# Patient Record
Sex: Male | Born: 1940 | Hispanic: No | Marital: Single
Health system: Southern US, Community
[De-identification: ages and names within clinical notes are randomized; demographics above are authoritative.]

---

## 2008-09-30 ENCOUNTER — Emergency Department: Payer: Self-pay | Admitting: Unknown Physician Specialty

## 2009-06-13 ENCOUNTER — Inpatient Hospital Stay: Payer: Self-pay | Admitting: Internal Medicine

## 2009-11-20 ENCOUNTER — Encounter: Payer: Self-pay | Admitting: Family Medicine

## 2009-11-23 ENCOUNTER — Ambulatory Visit: Payer: Self-pay | Admitting: Internal Medicine

## 2009-11-23 ENCOUNTER — Inpatient Hospital Stay: Payer: Self-pay | Admitting: Internal Medicine

## 2012-01-25 ENCOUNTER — Inpatient Hospital Stay: Payer: Self-pay | Admitting: Internal Medicine

## 2012-01-25 LAB — CK-MB: CK-MB: <0.5 ng/mL — ABNORMAL LOW (ref 0.5–3.6)

## 2012-01-25 LAB — CBC
HGB: 14.6 g/dL (ref 13.0–18.0)
MCH: 30 pg (ref 26.0–34.0)
MCHC: 32.8 g/dL (ref 32.0–36.0)
MCV: 92 fL (ref 80–100)
RDW: 12.8 % (ref 11.5–14.5)

## 2012-01-25 LAB — COMPREHENSIVE METABOLIC PANEL
Albumin: 3.3 g/dL — ABNORMAL LOW (ref 3.4–5.0)
Alkaline Phosphatase: 70 U/L (ref 50–136)
Bilirubin,Total: 0.6 mg/dL (ref 0.2–1.0)
EGFR (Non-African Amer.): >60
Osmolality: 283 (ref 275–301)
Potassium: 4 mmol/L (ref 3.5–5.1)
SGPT (ALT): 10 U/L — ABNORMAL LOW
Total Protein: 8.5 g/dL — ABNORMAL HIGH (ref 6.4–8.2)

## 2012-01-25 LAB — TROPONIN I
Troponin-I: <0.02 ng/mL
Troponin-I: <0.02 ng/mL

## 2012-01-25 LAB — CK TOTAL AND CKMB (NOT AT ARMC)
CK, Total: 75 U/L (ref 35–232)
CK-MB: <0.5 ng/mL — ABNORMAL LOW (ref 0.5–3.6)

## 2012-01-25 LAB — PROTIME-INR: INR: 0.9

## 2012-01-26 DIAGNOSIS — R079 Chest pain, unspecified: Secondary | ICD-10-CM

## 2012-01-26 LAB — BASIC METABOLIC PANEL
Anion Gap: 10 (ref 7–16)
BUN: 6 mg/dL — ABNORMAL LOW (ref 7–18)
Chloride: 108 mmol/L — ABNORMAL HIGH (ref 98–107)
Creatinine: 0.83 mg/dL (ref 0.60–1.30)
EGFR (Non-African Amer.): >60
Glucose: 94 mg/dL (ref 65–99)
Potassium: 3.7 mmol/L (ref 3.5–5.1)

## 2012-01-26 LAB — CBC WITH DIFFERENTIAL/PLATELET
Basophil #: 0 10*3/uL (ref 0.0–0.1)
Eosinophil #: 0 10*3/uL (ref 0.0–0.7)
Eosinophil %: 0.4 %
HCT: 38.6 % — ABNORMAL LOW (ref 40.0–52.0)
HGB: 13 g/dL (ref 13.0–18.0)
Lymphocyte #: 1.9 10*3/uL (ref 1.0–3.6)
Lymphocyte %: 37.5 %
MCH: 30.3 pg (ref 26.0–34.0)
MCV: 90 fL (ref 80–100)
Monocyte %: 10.7 %
Platelet: 151 10*3/uL (ref 150–440)
RDW: 13.5 % (ref 11.5–14.5)

## 2012-01-26 LAB — CK-MB: CK-MB: <0.5 ng/mL — ABNORMAL LOW (ref 0.5–3.6)

## 2013-03-27 ENCOUNTER — Emergency Department: Payer: Self-pay | Admitting: Internal Medicine

## 2013-03-27 LAB — COMPREHENSIVE METABOLIC PANEL
Albumin: 3.1 g/dL — ABNORMAL LOW (ref 3.4–5.0)
Alkaline Phosphatase: 76 U/L (ref 50–136)
Anion Gap: 2 — ABNORMAL LOW (ref 7–16)
BUN: 7 mg/dL (ref 7–18)
Calcium, Total: 9.2 mg/dL (ref 8.5–10.1)
Chloride: 106 mmol/L (ref 98–107)
Co2: 30 mmol/L (ref 21–32)
EGFR (African American): >60
EGFR (Non-African Amer.): >60
Glucose: 98 mg/dL (ref 65–99)
Osmolality: 274 (ref 275–301)
Potassium: 4.1 mmol/L (ref 3.5–5.1)
SGOT(AST): 14 U/L — ABNORMAL LOW (ref 15–37)
SGPT (ALT): 10 U/L — ABNORMAL LOW (ref 12–78)

## 2013-03-27 LAB — CBC WITH DIFFERENTIAL/PLATELET
Basophil #: 0 10*3/uL (ref 0.0–0.1)
Eosinophil #: 0 10*3/uL (ref 0.0–0.7)
HCT: 40.7 % (ref 40.0–52.0)
HGB: 13.3 g/dL (ref 13.0–18.0)
Lymphocyte #: 1.3 10*3/uL (ref 1.0–3.6)
Lymphocyte %: 20.4 %
MCH: 29.5 pg (ref 26.0–34.0)
MCHC: 32.7 g/dL (ref 32.0–36.0)
MCV: 90 fL (ref 80–100)
Monocyte %: 8.5 %
WBC: 6.5 10*3/uL (ref 3.8–10.6)

## 2013-04-07 ENCOUNTER — Ambulatory Visit: Payer: Self-pay | Admitting: Oncology

## 2013-04-07 LAB — CBC WITH DIFFERENTIAL/PLATELET
Basophil #: 0 10*3/uL (ref 0.0–0.1)
Basophil %: 0.6 %
Eosinophil #: 0 10*3/uL (ref 0.0–0.7)
Eosinophil %: 0.1 %
HGB: 14.2 g/dL (ref 13.0–18.0)
Lymphocyte #: 1.5 10*3/uL (ref 1.0–3.6)
Lymphocyte %: 19.5 %
MCH: 29.3 pg (ref 26.0–34.0)
MCV: 89 fL (ref 80–100)
Monocyte #: 0.7 x10 3/mm (ref 0.2–1.0)
Neutrophil #: 5.3 10*3/uL (ref 1.4–6.5)
Platelet: 233 10*3/uL (ref 150–440)
RDW: 12.7 % (ref 11.5–14.5)

## 2013-04-07 LAB — COMPREHENSIVE METABOLIC PANEL
Alkaline Phosphatase: 87 U/L (ref 50–136)
BUN: 8 mg/dL (ref 7–18)
Bilirubin,Total: 0.6 mg/dL (ref 0.2–1.0)
Calcium, Total: 10.1 mg/dL (ref 8.5–10.1)
Co2: 29 mmol/L (ref 21–32)
Creatinine: 1.09 mg/dL (ref 0.60–1.30)
EGFR (African American): >60
EGFR (Non-African Amer.): >60
Glucose: 131 mg/dL — ABNORMAL HIGH (ref 65–99)
Potassium: 4 mmol/L (ref 3.5–5.1)
SGOT(AST): 11 U/L — ABNORMAL LOW (ref 15–37)
SGPT (ALT): 13 U/L (ref 12–78)
Sodium: 138 mmol/L (ref 136–145)
Total Protein: 9.2 g/dL — ABNORMAL HIGH (ref 6.4–8.2)

## 2013-04-12 ENCOUNTER — Ambulatory Visit: Payer: Self-pay | Admitting: Radiation Oncology

## 2013-04-14 ENCOUNTER — Ambulatory Visit: Payer: Self-pay | Admitting: Oncology

## 2013-04-18 ENCOUNTER — Ambulatory Visit: Payer: Self-pay | Admitting: Vascular Surgery

## 2013-04-20 ENCOUNTER — Ambulatory Visit: Payer: Self-pay | Admitting: Oncology

## 2013-04-20 LAB — CBC CANCER CENTER
Basophil #: 0 x10 3/mm (ref 0.0–0.1)
Basophil %: 0.5 %
Eosinophil #: 0 x10 3/mm (ref 0.0–0.7)
Eosinophil %: 0.1 %
HCT: 37.4 % — ABNORMAL LOW (ref 40.0–52.0)
HGB: 12.4 g/dL — ABNORMAL LOW (ref 13.0–18.0)
MCH: 29.2 pg (ref 26.0–34.0)
MCHC: 33.1 g/dL (ref 32.0–36.0)
MCV: 88 fL (ref 80–100)
Monocyte #: 0.8 x10 3/mm (ref 0.2–1.0)
Monocyte %: 10.2 %
Neutrophil %: 74.9 %
RBC: 4.24 10*6/uL — ABNORMAL LOW (ref 4.40–5.90)
RDW: 13.1 % (ref 11.5–14.5)

## 2013-04-20 LAB — COMPREHENSIVE METABOLIC PANEL
Albumin: 2.7 g/dL — ABNORMAL LOW (ref 3.4–5.0)
Alkaline Phosphatase: 71 U/L (ref 50–136)
Anion Gap: 8 (ref 7–16)
BUN: 8 mg/dL (ref 7–18)
Bilirubin,Total: 0.6 mg/dL (ref 0.2–1.0)
Chloride: 101 mmol/L (ref 98–107)
Co2: 29 mmol/L (ref 21–32)
EGFR (African American): >60
EGFR (Non-African Amer.): >60
Glucose: 114 mg/dL — ABNORMAL HIGH (ref 65–99)
SGOT(AST): 11 U/L — ABNORMAL LOW (ref 15–37)
Total Protein: 8.2 g/dL (ref 6.4–8.2)

## 2013-04-25 LAB — CBC CANCER CENTER
Basophil #: 0 x10 3/mm (ref 0.0–0.1)
Basophil %: 0.9 %
Eosinophil #: 0 x10 3/mm (ref 0.0–0.7)
MCH: 29.7 pg (ref 26.0–34.0)
MCHC: 33.7 g/dL (ref 32.0–36.0)
MCV: 88 fL (ref 80–100)
Monocyte #: 0.1 x10 3/mm — ABNORMAL LOW (ref 0.2–1.0)
Monocyte %: 2 %
Neutrophil %: 75 %
Platelet: 165 x10 3/mm (ref 150–440)
RBC: 4.02 10*6/uL — ABNORMAL LOW (ref 4.40–5.90)
WBC: 3.7 x10 3/mm — ABNORMAL LOW (ref 3.8–10.6)

## 2013-04-25 LAB — COMPREHENSIVE METABOLIC PANEL
Albumin: 2.7 g/dL — ABNORMAL LOW (ref 3.4–5.0)
Alkaline Phosphatase: 63 U/L (ref 50–136)
BUN: 13 mg/dL (ref 7–18)
Calcium, Total: 9.2 mg/dL (ref 8.5–10.1)
Chloride: 97 mmol/L — ABNORMAL LOW (ref 98–107)
Creatinine: 0.83 mg/dL (ref 0.60–1.30)
EGFR (Non-African Amer.): >60
Osmolality: 275 (ref 275–301)
Potassium: 3.7 mmol/L (ref 3.5–5.1)
SGOT(AST): 12 U/L — ABNORMAL LOW (ref 15–37)
SGPT (ALT): 17 U/L (ref 12–78)

## 2013-05-02 LAB — BASIC METABOLIC PANEL
Anion Gap: 8 (ref 7–16)
BUN: 10 mg/dL (ref 7–18)
Calcium, Total: 9.3 mg/dL (ref 8.5–10.1)
Chloride: 103 mmol/L (ref 98–107)
Co2: 30 mmol/L (ref 21–32)
Glucose: 109 mg/dL — ABNORMAL HIGH (ref 65–99)
Osmolality: 281 (ref 275–301)
Potassium: 3.4 mmol/L — ABNORMAL LOW (ref 3.5–5.1)

## 2013-05-02 LAB — CBC CANCER CENTER
Basophil #: 0.1 x10 3/mm (ref 0.0–0.1)
HCT: 36.4 % — ABNORMAL LOW (ref 40.0–52.0)
HGB: 11.7 g/dL — ABNORMAL LOW (ref 13.0–18.0)
Lymphocyte %: 9.4 %
MCHC: 32.3 g/dL (ref 32.0–36.0)
MCV: 89 fL (ref 80–100)
Monocyte %: 4.3 %
Neutrophil #: 18.2 x10 3/mm — ABNORMAL HIGH (ref 1.4–6.5)
Neutrophil %: 85.9 %
Platelet: 126 x10 3/mm — ABNORMAL LOW (ref 150–440)
RBC: 4.1 10*6/uL — ABNORMAL LOW (ref 4.40–5.90)
RDW: 13.5 % (ref 11.5–14.5)
WBC: 21.2 x10 3/mm — ABNORMAL HIGH (ref 3.8–10.6)

## 2013-05-11 LAB — CBC CANCER CENTER
Basophil #: 0.1 x10 3/mm (ref 0.0–0.1)
Basophil %: 1.6 %
Eosinophil #: 0 x10 3/mm (ref 0.0–0.7)
Eosinophil %: 0 %
HGB: 10.7 g/dL — ABNORMAL LOW (ref 13.0–18.0)
Lymphocyte #: 0.9 x10 3/mm — ABNORMAL LOW (ref 1.0–3.6)
Lymphocyte %: 11.5 %
MCH: 29.3 pg (ref 26.0–34.0)
MCV: 89 fL (ref 80–100)
Monocyte #: 0.7 x10 3/mm (ref 0.2–1.0)
Neutrophil %: 78.2 %
Platelet: 227 x10 3/mm (ref 150–440)
RBC: 3.65 10*6/uL — ABNORMAL LOW (ref 4.40–5.90)
RDW: 14 % (ref 11.5–14.5)
WBC: 7.7 x10 3/mm (ref 3.8–10.6)

## 2013-05-11 LAB — COMPREHENSIVE METABOLIC PANEL
Albumin: 2.5 g/dL — ABNORMAL LOW (ref 3.4–5.0)
Alkaline Phosphatase: 77 U/L (ref 50–136)
BUN: 8 mg/dL (ref 7–18)
Calcium, Total: 9.3 mg/dL (ref 8.5–10.1)
Chloride: 103 mmol/L (ref 98–107)
SGPT (ALT): 16 U/L (ref 12–78)
Sodium: 140 mmol/L (ref 136–145)
Total Protein: 7.4 g/dL (ref 6.4–8.2)

## 2013-05-15 ENCOUNTER — Ambulatory Visit: Payer: Self-pay | Admitting: Oncology

## 2013-05-16 LAB — CBC CANCER CENTER
Basophil %: 0.5 %
Eosinophil #: 0 x10 3/mm (ref 0.0–0.7)
Eosinophil %: 0 %
HCT: 33 % — ABNORMAL LOW (ref 40.0–52.0)
HGB: 10.9 g/dL — ABNORMAL LOW (ref 13.0–18.0)
Lymphocyte #: 0.8 x10 3/mm — ABNORMAL LOW (ref 1.0–3.6)
Lymphocyte %: 23.1 %
MCH: 29.2 pg (ref 26.0–34.0)
MCHC: 33.1 g/dL (ref 32.0–36.0)
MCV: 88 fL (ref 80–100)
Monocyte #: 0.1 x10 3/mm — ABNORMAL LOW (ref 0.2–1.0)
Monocyte %: 1.6 %
Neutrophil #: 2.7 x10 3/mm (ref 1.4–6.5)
Neutrophil %: 74.8 %
RDW: 14 % (ref 11.5–14.5)
WBC: 3.6 x10 3/mm — ABNORMAL LOW (ref 3.8–10.6)

## 2013-05-16 LAB — COMPREHENSIVE METABOLIC PANEL
BUN: 16 mg/dL (ref 7–18)
Bilirubin,Total: 0.5 mg/dL (ref 0.2–1.0)
Calcium, Total: 9.5 mg/dL (ref 8.5–10.1)
Chloride: 98 mmol/L (ref 98–107)
Creatinine: 0.91 mg/dL (ref 0.60–1.30)
EGFR (African American): >60
EGFR (Non-African Amer.): >60
Glucose: 123 mg/dL — ABNORMAL HIGH (ref 65–99)
SGOT(AST): 15 U/L (ref 15–37)
SGPT (ALT): 15 U/L (ref 12–78)
Sodium: 137 mmol/L (ref 136–145)

## 2013-05-25 LAB — CBC CANCER CENTER
Basophil %: 0.1 %
Eosinophil #: 0 x10 3/mm (ref 0.0–0.7)
Eosinophil %: 0 %
HCT: 35.5 % — ABNORMAL LOW (ref 40.0–52.0)
Lymphocyte %: 9.2 %
MCH: 29.7 pg (ref 26.0–34.0)
MCHC: 32.5 g/dL (ref 32.0–36.0)
Monocyte #: 1.1 x10 3/mm — ABNORMAL HIGH (ref 0.2–1.0)
Neutrophil %: 85 %
Platelet: 146 x10 3/mm — ABNORMAL LOW (ref 150–440)
RBC: 3.88 10*6/uL — ABNORMAL LOW (ref 4.40–5.90)
RDW: 15.9 % — ABNORMAL HIGH (ref 11.5–14.5)
WBC: 19.3 x10 3/mm — ABNORMAL HIGH (ref 3.8–10.6)

## 2013-06-02 LAB — CBC CANCER CENTER
Basophil %: 0.5 %
Lymphocyte #: 1.3 x10 3/mm (ref 1.0–3.6)
Lymphocyte %: 17.3 %
MCH: 30.3 pg (ref 26.0–34.0)
MCHC: 32.9 g/dL (ref 32.0–36.0)
MCV: 92 fL (ref 80–100)
Monocyte #: 0.7 x10 3/mm (ref 0.2–1.0)
Neutrophil #: 5.7 x10 3/mm (ref 1.4–6.5)
Neutrophil %: 72.8 %
RDW: 16.8 % — ABNORMAL HIGH (ref 11.5–14.5)

## 2013-06-08 LAB — CBC CANCER CENTER
Basophil #: 0.1 x10 3/mm (ref 0.0–0.1)
Basophil %: 0.9 %
Eosinophil #: 0 x10 3/mm (ref 0.0–0.7)
HCT: 33.2 % — ABNORMAL LOW (ref 40.0–52.0)
HGB: 11.1 g/dL — ABNORMAL LOW (ref 13.0–18.0)
Lymphocyte #: 1.2 x10 3/mm (ref 1.0–3.6)
MCHC: 33.4 g/dL (ref 32.0–36.0)
MCV: 92 fL (ref 80–100)
Monocyte %: 11 %
Neutrophil #: 4.5 x10 3/mm (ref 1.4–6.5)
Platelet: 186 x10 3/mm (ref 150–440)
RBC: 3.61 10*6/uL — ABNORMAL LOW (ref 4.40–5.90)
WBC: 6.5 x10 3/mm (ref 3.8–10.6)

## 2013-06-08 LAB — COMPREHENSIVE METABOLIC PANEL
Albumin: 3.1 g/dL — ABNORMAL LOW (ref 3.4–5.0)
Anion Gap: 9 (ref 7–16)
BUN: 8 mg/dL (ref 7–18)
Calcium, Total: 9.3 mg/dL (ref 8.5–10.1)
Chloride: 104 mmol/L (ref 98–107)
Co2: 28 mmol/L (ref 21–32)
EGFR (Non-African Amer.): >60
Glucose: 103 mg/dL — ABNORMAL HIGH (ref 65–99)
SGOT(AST): 9 U/L — ABNORMAL LOW (ref 15–37)
SGPT (ALT): 12 U/L (ref 12–78)
Sodium: 141 mmol/L (ref 136–145)

## 2013-06-13 LAB — COMPREHENSIVE METABOLIC PANEL
Albumin: 3 g/dL — ABNORMAL LOW (ref 3.4–5.0)
Alkaline Phosphatase: 58 U/L (ref 50–136)
Anion Gap: 4 — ABNORMAL LOW (ref 7–16)
BUN: 14 mg/dL (ref 7–18)
Bilirubin,Total: 0.5 mg/dL (ref 0.2–1.0)
Co2: 32 mmol/L (ref 21–32)
EGFR (Non-African Amer.): >60
Glucose: 103 mg/dL — ABNORMAL HIGH (ref 65–99)
Osmolality: 278 (ref 275–301)
SGOT(AST): 14 U/L — ABNORMAL LOW (ref 15–37)
Total Protein: 7.5 g/dL (ref 6.4–8.2)

## 2013-06-13 LAB — CBC CANCER CENTER
Basophil #: 0.1 x10 3/mm (ref 0.0–0.1)
Eosinophil #: 0 x10 3/mm (ref 0.0–0.7)
HCT: 32.1 % — ABNORMAL LOW (ref 40.0–52.0)
HGB: 10.5 g/dL — ABNORMAL LOW (ref 13.0–18.0)
Lymphocyte #: 1.1 x10 3/mm (ref 1.0–3.6)
MCH: 30 pg (ref 26.0–34.0)
MCV: 91 fL (ref 80–100)
Neutrophil %: 67.3 %
Platelet: 167 x10 3/mm (ref 150–440)
RBC: 3.52 10*6/uL — ABNORMAL LOW (ref 4.40–5.90)
RDW: 17.3 % — ABNORMAL HIGH (ref 11.5–14.5)
WBC: 3.8 x10 3/mm (ref 3.8–10.6)

## 2013-06-14 ENCOUNTER — Ambulatory Visit: Payer: Self-pay | Admitting: Oncology

## 2013-06-22 ENCOUNTER — Ambulatory Visit: Payer: Self-pay | Admitting: Oncology

## 2013-06-22 LAB — COMPREHENSIVE METABOLIC PANEL
Alkaline Phosphatase: 111 U/L (ref 50–136)
Anion Gap: 8 (ref 7–16)
BUN: 6 mg/dL — ABNORMAL LOW (ref 7–18)
Bilirubin,Total: 0.2 mg/dL (ref 0.2–1.0)
Chloride: 106 mmol/L (ref 98–107)
Co2: 31 mmol/L (ref 21–32)
Creatinine: 0.96 mg/dL (ref 0.60–1.30)
EGFR (African American): >60
EGFR (Non-African Amer.): >60
Glucose: 104 mg/dL — ABNORMAL HIGH (ref 65–99)
SGPT (ALT): 13 U/L (ref 12–78)
Sodium: 145 mmol/L (ref 136–145)

## 2013-06-22 LAB — CBC CANCER CENTER
Basophil #: 0.1 x10 3/mm (ref 0.0–0.1)
Basophil %: 0.6 %
Eosinophil %: 0 %
HCT: 33.4 % — ABNORMAL LOW (ref 40.0–52.0)
HGB: 10.9 g/dL — ABNORMAL LOW (ref 13.0–18.0)
Lymphocyte #: 2.3 x10 3/mm (ref 1.0–3.6)
Lymphocyte %: 10.5 %
Monocyte #: 1.3 x10 3/mm — ABNORMAL HIGH (ref 0.2–1.0)
Neutrophil %: 83.2 %
RBC: 3.56 10*6/uL — ABNORMAL LOW (ref 4.40–5.90)
RDW: 18.1 % — ABNORMAL HIGH (ref 11.5–14.5)

## 2013-06-27 LAB — CBC CANCER CENTER
Basophil #: 0 x10 3/mm (ref 0.0–0.1)
HCT: 30.7 % — ABNORMAL LOW (ref 40.0–52.0)
HGB: 10.2 g/dL — ABNORMAL LOW (ref 13.0–18.0)
Lymphocyte #: 1.4 x10 3/mm (ref 1.0–3.6)
Lymphocyte %: 18.5 %
MCH: 30.8 pg (ref 26.0–34.0)
MCHC: 33.3 g/dL (ref 32.0–36.0)
MCV: 93 fL (ref 80–100)
Monocyte #: 0.5 x10 3/mm (ref 0.2–1.0)
Neutrophil #: 5.5 x10 3/mm (ref 1.4–6.5)
Neutrophil %: 74.2 %
Platelet: 137 x10 3/mm — ABNORMAL LOW (ref 150–440)
WBC: 7.3 x10 3/mm (ref 3.8–10.6)

## 2013-06-27 LAB — COMPREHENSIVE METABOLIC PANEL
Albumin: 2.9 g/dL — ABNORMAL LOW (ref 3.4–5.0)
BUN: 6 mg/dL — ABNORMAL LOW (ref 7–18)
Bilirubin,Total: 0.4 mg/dL (ref 0.2–1.0)
Calcium, Total: 8.3 mg/dL — ABNORMAL LOW (ref 8.5–10.1)
Chloride: 108 mmol/L — ABNORMAL HIGH (ref 98–107)
Co2: 32 mmol/L (ref 21–32)
Creatinine: 0.76 mg/dL (ref 0.60–1.30)
Glucose: 97 mg/dL (ref 65–99)
Osmolality: 292 (ref 275–301)
Potassium: 3.3 mmol/L — ABNORMAL LOW (ref 3.5–5.1)
SGOT(AST): 10 U/L — ABNORMAL LOW (ref 15–37)
SGPT (ALT): 12 U/L (ref 12–78)
Sodium: 148 mmol/L — ABNORMAL HIGH (ref 136–145)
Total Protein: 6.8 g/dL (ref 6.4–8.2)

## 2013-07-11 LAB — CBC CANCER CENTER
Basophil #: 0 x10 3/mm (ref 0.0–0.1)
Eosinophil #: 0 x10 3/mm (ref 0.0–0.7)
HCT: 31.7 % — ABNORMAL LOW (ref 40.0–52.0)
Lymphocyte %: 14.9 %
MCH: 31.2 pg (ref 26.0–34.0)
MCHC: 33.8 g/dL (ref 32.0–36.0)
MCV: 92 fL (ref 80–100)
Monocyte #: 0.6 x10 3/mm (ref 0.2–1.0)
Neutrophil #: 6.3 x10 3/mm (ref 1.4–6.5)
Neutrophil %: 77.1 %
Platelet: 222 x10 3/mm (ref 150–440)
RBC: 3.43 10*6/uL — ABNORMAL LOW (ref 4.40–5.90)
RDW: 17.2 % — ABNORMAL HIGH (ref 11.5–14.5)
WBC: 8.2 x10 3/mm (ref 3.8–10.6)

## 2013-07-11 LAB — COMPREHENSIVE METABOLIC PANEL
Albumin: 3.1 g/dL — ABNORMAL LOW (ref 3.4–5.0)
Alkaline Phosphatase: 72 U/L (ref 50–136)
BUN: 8 mg/dL (ref 7–18)
Bilirubin,Total: 0.5 mg/dL (ref 0.2–1.0)
Chloride: 105 mmol/L (ref 98–107)
Co2: 29 mmol/L (ref 21–32)
Creatinine: 0.81 mg/dL (ref 0.60–1.30)
EGFR (African American): >60
EGFR (Non-African Amer.): >60
Glucose: 110 mg/dL — ABNORMAL HIGH (ref 65–99)
Osmolality: 277 (ref 275–301)
SGPT (ALT): 11 U/L — ABNORMAL LOW (ref 12–78)

## 2013-07-15 ENCOUNTER — Ambulatory Visit: Payer: Self-pay | Admitting: Oncology

## 2013-07-18 LAB — CBC CANCER CENTER
Basophil #: 0 x10 3/mm (ref 0.0–0.1)
Basophil %: 0.6 %
HGB: 10.7 g/dL — ABNORMAL LOW (ref 13.0–18.0)
Lymphocyte #: 0.6 x10 3/mm — ABNORMAL LOW (ref 1.0–3.6)
Monocyte #: 0.6 x10 3/mm (ref 0.2–1.0)
Monocyte %: 9.3 %
Neutrophil %: 80.6 %
Platelet: 197 x10 3/mm (ref 150–440)
RDW: 16.7 % — ABNORMAL HIGH (ref 11.5–14.5)
WBC: 6.9 x10 3/mm (ref 3.8–10.6)

## 2013-07-18 LAB — BASIC METABOLIC PANEL
BUN: 9 mg/dL (ref 7–18)
Chloride: 99 mmol/L (ref 98–107)
Creatinine: 0.91 mg/dL (ref 0.60–1.30)
EGFR (African American): >60
EGFR (Non-African Amer.): >60
Glucose: 119 mg/dL — ABNORMAL HIGH (ref 65–99)
Osmolality: 276 (ref 275–301)
Sodium: 138 mmol/L (ref 136–145)

## 2013-07-25 LAB — COMPREHENSIVE METABOLIC PANEL
Alkaline Phosphatase: 62 U/L (ref 50–136)
Anion Gap: 5 — ABNORMAL LOW (ref 7–16)
Bilirubin,Total: 0.3 mg/dL (ref 0.2–1.0)
Calcium, Total: 9 mg/dL (ref 8.5–10.1)
Creatinine: 0.7 mg/dL (ref 0.60–1.30)
EGFR (African American): >60
EGFR (Non-African Amer.): >60
Glucose: 120 mg/dL — ABNORMAL HIGH (ref 65–99)
Osmolality: 274 (ref 275–301)
SGOT(AST): 5 U/L — ABNORMAL LOW (ref 15–37)
Total Protein: 7.3 g/dL (ref 6.4–8.2)

## 2013-07-25 LAB — CBC CANCER CENTER
Basophil #: 0 x10 3/mm (ref 0.0–0.1)
Basophil %: 0.5 %
Eosinophil #: 0 x10 3/mm (ref 0.0–0.7)
HCT: 30.6 % — ABNORMAL LOW (ref 40.0–52.0)
Lymphocyte %: 10 %
MCH: 31.2 pg (ref 26.0–34.0)
MCHC: 33.7 g/dL (ref 32.0–36.0)
MCV: 93 fL (ref 80–100)
Monocyte #: 0.5 x10 3/mm (ref 0.2–1.0)
Monocyte %: 9.9 %
Neutrophil #: 3.7 x10 3/mm (ref 1.4–6.5)
Neutrophil %: 79.5 %
Platelet: 184 x10 3/mm (ref 150–440)
RBC: 3.31 10*6/uL — ABNORMAL LOW (ref 4.40–5.90)
RDW: 16.1 % — ABNORMAL HIGH (ref 11.5–14.5)

## 2013-08-01 LAB — CBC CANCER CENTER
Basophil #: 0 x10 3/mm (ref 0.0–0.1)
Basophil %: 0.7 %
Eosinophil #: 0 x10 3/mm (ref 0.0–0.7)
HCT: 31.5 % — ABNORMAL LOW (ref 40.0–52.0)
HGB: 10.6 g/dL — ABNORMAL LOW (ref 13.0–18.0)
MCHC: 33.5 g/dL (ref 32.0–36.0)
MCV: 94 fL (ref 80–100)
Monocyte #: 0.3 x10 3/mm (ref 0.2–1.0)
Monocyte %: 9.9 %
RBC: 3.36 10*6/uL — ABNORMAL LOW (ref 4.40–5.90)
RDW: 16 % — ABNORMAL HIGH (ref 11.5–14.5)

## 2013-08-01 LAB — COMPREHENSIVE METABOLIC PANEL
Alkaline Phosphatase: 57 U/L (ref 50–136)
Anion Gap: 6 — ABNORMAL LOW (ref 7–16)
Calcium, Total: 9 mg/dL (ref 8.5–10.1)
Co2: 29 mmol/L (ref 21–32)
Creatinine: 0.77 mg/dL (ref 0.60–1.30)
Glucose: 103 mg/dL — ABNORMAL HIGH (ref 65–99)
Osmolality: 273 (ref 275–301)
Potassium: 4.1 mmol/L (ref 3.5–5.1)
SGPT (ALT): 10 U/L — ABNORMAL LOW (ref 12–78)
Sodium: 137 mmol/L (ref 136–145)
Total Protein: 7.1 g/dL (ref 6.4–8.2)

## 2013-08-08 LAB — CBC CANCER CENTER
Basophil #: 0 x10 3/mm (ref 0.0–0.1)
Basophil %: 0.6 %
Lymphocyte #: 0.3 x10 3/mm — ABNORMAL LOW (ref 1.0–3.6)
Lymphocyte %: 10.3 %
MCHC: 33.4 g/dL (ref 32.0–36.0)
MCV: 94 fL (ref 80–100)
Monocyte #: 0.2 x10 3/mm (ref 0.2–1.0)
Monocyte %: 7.9 %
Neutrophil #: 2 x10 3/mm (ref 1.4–6.5)
Neutrophil %: 81 %
RDW: 15.6 % — ABNORMAL HIGH (ref 11.5–14.5)

## 2013-08-08 LAB — BASIC METABOLIC PANEL
Calcium, Total: 9.3 mg/dL (ref 8.5–10.1)
Chloride: 103 mmol/L (ref 98–107)
Co2: 29 mmol/L (ref 21–32)
Creatinine: 0.64 mg/dL (ref 0.60–1.30)
Glucose: 102 mg/dL — ABNORMAL HIGH (ref 65–99)
Potassium: 4.3 mmol/L (ref 3.5–5.1)

## 2013-08-10 LAB — CBC CANCER CENTER
Basophil %: 0.5 %
Eosinophil %: 0 %
Lymphocyte #: 0.3 x10 3/mm — ABNORMAL LOW (ref 1.0–3.6)
MCHC: 33.6 g/dL (ref 32.0–36.0)
Monocyte %: 6.9 %
Neutrophil #: 2 x10 3/mm (ref 1.4–6.5)
Platelet: 107 x10 3/mm — ABNORMAL LOW (ref 150–440)
RBC: 3.57 10*6/uL — ABNORMAL LOW (ref 4.40–5.90)
RDW: 16.1 % — ABNORMAL HIGH (ref 11.5–14.5)

## 2013-08-15 ENCOUNTER — Ambulatory Visit: Payer: Self-pay | Admitting: Oncology

## 2013-08-16 LAB — CBC CANCER CENTER
Basophil #: 0 x10 3/mm (ref 0.0–0.1)
Basophil %: 0.5 %
HCT: 34.3 % — ABNORMAL LOW (ref 40.0–52.0)
Lymphocyte #: 0.2 x10 3/mm — ABNORMAL LOW (ref 1.0–3.6)
Lymphocyte %: 12.2 %
MCH: 31.4 pg (ref 26.0–34.0)
MCHC: 32.7 g/dL (ref 32.0–36.0)
MCV: 96 fL (ref 80–100)
Monocyte %: 7.8 %
Neutrophil %: 79.4 %
Platelet: 89 x10 3/mm — ABNORMAL LOW (ref 150–440)
RBC: 3.57 10*6/uL — ABNORMAL LOW (ref 4.40–5.90)
RDW: 15.8 % — ABNORMAL HIGH (ref 11.5–14.5)

## 2013-08-16 LAB — BASIC METABOLIC PANEL
Anion Gap: 5 — ABNORMAL LOW (ref 7–16)
BUN: 14 mg/dL (ref 7–18)
Chloride: 104 mmol/L (ref 98–107)
Creatinine: 0.77 mg/dL (ref 0.60–1.30)
EGFR (African American): >60
Osmolality: 276 (ref 275–301)
Potassium: 4.2 mmol/L (ref 3.5–5.1)
Sodium: 138 mmol/L (ref 136–145)

## 2013-08-23 LAB — CBC CANCER CENTER
Basophil %: 0.6 %
Eosinophil #: 0 x10 3/mm (ref 0.0–0.7)
Eosinophil %: 0.5 %
HGB: 11 g/dL — ABNORMAL LOW (ref 13.0–18.0)
Lymphocyte #: 0.3 x10 3/mm — ABNORMAL LOW (ref 1.0–3.6)
Lymphocyte %: 18.2 %
Monocyte #: 0.1 x10 3/mm — ABNORMAL LOW (ref 0.2–1.0)
Monocyte %: 8.8 %
Neutrophil #: 1.1 x10 3/mm — ABNORMAL LOW (ref 1.4–6.5)
Neutrophil %: 71.9 %
Platelet: 111 x10 3/mm — ABNORMAL LOW (ref 150–440)
RBC: 3.5 10*6/uL — ABNORMAL LOW (ref 4.40–5.90)
RDW: 15.9 % — ABNORMAL HIGH (ref 11.5–14.5)

## 2013-08-23 LAB — BASIC METABOLIC PANEL
BUN: 10 mg/dL (ref 7–18)
Co2: 29 mmol/L (ref 21–32)
Creatinine: 0.86 mg/dL (ref 0.60–1.30)
EGFR (African American): >60
Osmolality: 278 (ref 275–301)
Potassium: 3.9 mmol/L (ref 3.5–5.1)

## 2013-08-24 LAB — CBC CANCER CENTER
HCT: 32.3 % — ABNORMAL LOW (ref 40.0–52.0)
HGB: 10.5 g/dL — ABNORMAL LOW (ref 13.0–18.0)
Lymphocyte #: 0.2 x10 3/mm — ABNORMAL LOW (ref 1.0–3.6)
Lymphocyte %: 7.2 %
MCHC: 32.6 g/dL (ref 32.0–36.0)
MCV: 96 fL (ref 80–100)
Monocyte #: 0.1 x10 3/mm — ABNORMAL LOW (ref 0.2–1.0)
Monocyte %: 5 %
Neutrophil #: 2 x10 3/mm (ref 1.4–6.5)
Neutrophil %: 87.6 %
Platelet: 112 x10 3/mm — ABNORMAL LOW (ref 150–440)
RDW: 16 % — ABNORMAL HIGH (ref 11.5–14.5)
WBC: 2.3 x10 3/mm — ABNORMAL LOW (ref 3.8–10.6)

## 2013-08-30 LAB — COMPREHENSIVE METABOLIC PANEL
Albumin: 3.2 g/dL — ABNORMAL LOW (ref 3.4–5.0)
Anion Gap: 9 (ref 7–16)
Bilirubin,Total: 0.3 mg/dL (ref 0.2–1.0)
Co2: 29 mmol/L (ref 21–32)
Creatinine: 0.77 mg/dL (ref 0.60–1.30)
EGFR (African American): >60
EGFR (Non-African Amer.): >60
Osmolality: 281 (ref 275–301)
Potassium: 3.9 mmol/L (ref 3.5–5.1)
Total Protein: 6.8 g/dL (ref 6.4–8.2)

## 2013-08-30 LAB — CBC CANCER CENTER
Basophil #: 0 x10 3/mm (ref 0.0–0.1)
Basophil %: 0.3 %
Eosinophil #: 0 x10 3/mm (ref 0.0–0.7)
Eosinophil %: 0.4 %
HCT: 31.5 % — ABNORMAL LOW (ref 40.0–52.0)
HGB: 10.5 g/dL — ABNORMAL LOW (ref 13.0–18.0)
Lymphocyte #: 0.2 x10 3/mm — ABNORMAL LOW (ref 1.0–3.6)
Lymphocyte %: 9.5 %
MCHC: 33.5 g/dL (ref 32.0–36.0)
MCV: 95 fL (ref 80–100)
Monocyte %: 9.5 %
Platelet: 119 x10 3/mm — ABNORMAL LOW (ref 150–440)
RBC: 3.3 10*6/uL — ABNORMAL LOW (ref 4.40–5.90)
RDW: 16.6 % — ABNORMAL HIGH (ref 11.5–14.5)
WBC: 2 x10 3/mm — CL (ref 3.8–10.6)

## 2013-09-14 ENCOUNTER — Ambulatory Visit: Payer: Self-pay | Admitting: Oncology

## 2013-10-03 ENCOUNTER — Ambulatory Visit: Payer: Self-pay | Admitting: Oncology

## 2013-10-11 ENCOUNTER — Ambulatory Visit: Payer: Self-pay | Admitting: Oncology

## 2013-10-13 LAB — COMPREHENSIVE METABOLIC PANEL
Albumin: 2.9 g/dL — ABNORMAL LOW (ref 3.4–5.0)
Alkaline Phosphatase: 57 U/L (ref 50–136)
Anion Gap: 4 — ABNORMAL LOW (ref 7–16)
BUN: 4 mg/dL — ABNORMAL LOW (ref 7–18)
Bilirubin,Total: 0.4 mg/dL (ref 0.2–1.0)
Calcium, Total: 8.9 mg/dL (ref 8.5–10.1)
Chloride: 107 mmol/L (ref 98–107)
Creatinine: 0.76 mg/dL (ref 0.60–1.30)
EGFR (African American): >60
EGFR (Non-African Amer.): >60
Glucose: 102 mg/dL — ABNORMAL HIGH (ref 65–99)
Osmolality: 284 (ref 275–301)
SGOT(AST): 12 U/L — ABNORMAL LOW (ref 15–37)
Sodium: 144 mmol/L (ref 136–145)
Total Protein: 7 g/dL (ref 6.4–8.2)

## 2013-10-13 LAB — CBC CANCER CENTER
Basophil #: 0 x10 3/mm (ref 0.0–0.1)
Basophil %: 0.2 %
Eosinophil #: 0 x10 3/mm (ref 0.0–0.7)
Eosinophil %: 0.4 %
Lymphocyte #: 0.4 x10 3/mm — ABNORMAL LOW (ref 1.0–3.6)
Lymphocyte %: 12.6 %
MCH: 32.5 pg (ref 26.0–34.0)
MCV: 100 fL (ref 80–100)
Monocyte #: 0.4 x10 3/mm (ref 0.2–1.0)
Monocyte %: 13 %
Neutrophil #: 2.4 x10 3/mm (ref 1.4–6.5)
Neutrophil %: 73.8 %
Platelet: 137 x10 3/mm — ABNORMAL LOW (ref 150–440)
RDW: 17.4 % — ABNORMAL HIGH (ref 11.5–14.5)
WBC: 3.3 x10 3/mm — ABNORMAL LOW (ref 3.8–10.6)

## 2013-10-15 ENCOUNTER — Ambulatory Visit: Payer: Self-pay | Admitting: Oncology

## 2013-11-16 ENCOUNTER — Ambulatory Visit: Payer: Self-pay | Admitting: Oncology

## 2013-12-01 LAB — CBC CANCER CENTER
Eosinophil #: 0 x10 3/mm (ref 0.0–0.7)
HCT: 31.7 % — ABNORMAL LOW (ref 40.0–52.0)
HGB: 10.2 g/dL — ABNORMAL LOW (ref 13.0–18.0)
Lymphocyte %: 4.3 %
MCH: 33 pg (ref 26.0–34.0)
MCV: 103 fL — ABNORMAL HIGH (ref 80–100)
Monocyte #: 0.5 x10 3/mm (ref 0.2–1.0)
Monocyte %: 6.9 %
RDW: 13.6 % (ref 11.5–14.5)
WBC: 7.4 x10 3/mm (ref 3.8–10.6)

## 2013-12-01 LAB — COMPREHENSIVE METABOLIC PANEL
Alkaline Phosphatase: 57 U/L
BUN: 10 mg/dL (ref 7–18)
Bilirubin,Total: 0.5 mg/dL (ref 0.2–1.0)
Co2: 31 mmol/L (ref 21–32)
Creatinine: 0.72 mg/dL (ref 0.60–1.30)
Osmolality: 282 (ref 275–301)
Potassium: 4.3 mmol/L (ref 3.5–5.1)
Sodium: 141 mmol/L (ref 136–145)
Total Protein: 7 g/dL (ref 6.4–8.2)

## 2013-12-02 ENCOUNTER — Inpatient Hospital Stay: Payer: Self-pay | Admitting: Student

## 2013-12-02 LAB — BASIC METABOLIC PANEL
BUN: 11 mg/dL (ref 7–18)
Creatinine: 0.73 mg/dL (ref 0.60–1.30)
Glucose: 118 mg/dL — ABNORMAL HIGH (ref 65–99)
Osmolality: 276 (ref 275–301)
Potassium: 4 mmol/L (ref 3.5–5.1)

## 2013-12-02 LAB — CBC WITH DIFFERENTIAL/PLATELET
Basophil #: 0 10*3/uL (ref 0.0–0.1)
Basophil %: 0.3 %
Eosinophil #: 0 10*3/uL (ref 0.0–0.7)
Eosinophil %: 0.2 %
HCT: 30.3 % — ABNORMAL LOW (ref 40.0–52.0)
Lymphocyte #: 0.2 10*3/uL — ABNORMAL LOW (ref 1.0–3.6)
Lymphocyte %: 1.7 %
MCHC: 33.1 g/dL (ref 32.0–36.0)
MCV: 102 fL — ABNORMAL HIGH (ref 80–100)
Monocyte #: 0.5 x10 3/mm (ref 0.2–1.0)
Neutrophil #: 8.9 10*3/uL — ABNORMAL HIGH (ref 1.4–6.5)
RBC: 2.98 10*6/uL — ABNORMAL LOW (ref 4.40–5.90)
WBC: 9.6 10*3/uL (ref 3.8–10.6)

## 2013-12-03 LAB — CBC WITH DIFFERENTIAL/PLATELET
Basophil #: 0 10*3/uL (ref 0.0–0.1)
Basophil %: 0.1 %
HGB: 9 g/dL — ABNORMAL LOW (ref 13.0–18.0)
Lymphocyte #: 0.3 10*3/uL — ABNORMAL LOW (ref 1.0–3.6)
Lymphocyte %: 3.5 %
MCH: 33 pg (ref 26.0–34.0)
MCV: 102 fL — ABNORMAL HIGH (ref 80–100)
Neutrophil #: 6.9 10*3/uL — ABNORMAL HIGH (ref 1.4–6.5)
Neutrophil %: 89.5 %
Platelet: 176 10*3/uL (ref 150–440)
RBC: 2.72 10*6/uL — ABNORMAL LOW (ref 4.40–5.90)
RDW: 13.7 % (ref 11.5–14.5)

## 2013-12-03 LAB — BASIC METABOLIC PANEL
Anion Gap: 5 — ABNORMAL LOW (ref 7–16)
Calcium, Total: 9.1 mg/dL (ref 8.5–10.1)
Co2: 30 mmol/L (ref 21–32)
Creatinine: 0.6 mg/dL (ref 0.60–1.30)
EGFR (African American): >60
EGFR (Non-African Amer.): >60
Sodium: 138 mmol/L (ref 136–145)

## 2013-12-06 LAB — BASIC METABOLIC PANEL
BUN: 5 mg/dL — ABNORMAL LOW (ref 7–18)
Co2: 31 mmol/L (ref 21–32)
Creatinine: 0.53 mg/dL — ABNORMAL LOW (ref 0.60–1.30)
EGFR (African American): >60
EGFR (Non-African Amer.): >60
Glucose: 101 mg/dL — ABNORMAL HIGH (ref 65–99)
Osmolality: 279 (ref 275–301)
Sodium: 141 mmol/L (ref 136–145)

## 2013-12-06 LAB — CBC WITH DIFFERENTIAL/PLATELET
Basophil #: 0 10*3/uL (ref 0.0–0.1)
Basophil %: 0.3 %
Eosinophil #: 0 10*3/uL (ref 0.0–0.7)
Eosinophil %: 0.4 %
HCT: 25.7 % — ABNORMAL LOW (ref 40.0–52.0)
HGB: 8.5 g/dL — ABNORMAL LOW (ref 13.0–18.0)
MCH: 34 pg (ref 26.0–34.0)
MCHC: 33.2 g/dL (ref 32.0–36.0)
MCV: 102 fL — ABNORMAL HIGH (ref 80–100)
Monocyte %: 7.5 %
Neutrophil %: 86.4 %
Platelet: 180 10*3/uL (ref 150–440)
WBC: 5 10*3/uL (ref 3.8–10.6)

## 2013-12-06 LAB — MAGNESIUM: Magnesium: 1.3 mg/dL — ABNORMAL LOW

## 2013-12-07 LAB — CBC WITH DIFFERENTIAL/PLATELET
Basophil #: 0 10*3/uL (ref 0.0–0.1)
HCT: 25.2 % — ABNORMAL LOW (ref 40.0–52.0)
MCH: 32.7 pg (ref 26.0–34.0)
Monocyte #: 0.5 x10 3/mm (ref 0.2–1.0)
Neutrophil %: 81.9 %
Platelet: 181 10*3/uL (ref 150–440)
RDW: 13.5 % (ref 11.5–14.5)

## 2013-12-07 LAB — CULTURE, BLOOD (SINGLE)

## 2013-12-15 ENCOUNTER — Ambulatory Visit: Payer: Self-pay | Admitting: Oncology

## 2013-12-22 ENCOUNTER — Ambulatory Visit: Payer: Self-pay | Admitting: Oncology

## 2013-12-22 LAB — CBC CANCER CENTER
BASOS PCT: 0.3 %
Basophil #: 0 x10 3/mm (ref 0.0–0.1)
Eosinophil #: 0 x10 3/mm (ref 0.0–0.7)
Eosinophil %: 0.2 %
HCT: 31.5 % — ABNORMAL LOW (ref 40.0–52.0)
HGB: 9.9 g/dL — AB (ref 13.0–18.0)
LYMPHS ABS: 0.4 x10 3/mm — AB (ref 1.0–3.6)
Lymphocyte %: 3.4 %
MCH: 31.7 pg (ref 26.0–34.0)
MCHC: 31.3 g/dL — AB (ref 32.0–36.0)
MCV: 102 fL — AB (ref 80–100)
MONOS PCT: 8.2 %
Monocyte #: 0.9 x10 3/mm (ref 0.2–1.0)
NEUTROS ABS: 9.8 x10 3/mm — AB (ref 1.4–6.5)
Neutrophil %: 87.9 %
Platelet: 207 x10 3/mm (ref 150–440)
RBC: 3.11 10*6/uL — ABNORMAL LOW (ref 4.40–5.90)
RDW: 13.9 % (ref 11.5–14.5)
WBC: 11.2 x10 3/mm — ABNORMAL HIGH (ref 3.8–10.6)

## 2013-12-22 LAB — COMPREHENSIVE METABOLIC PANEL
ANION GAP: 7 (ref 7–16)
Albumin: 2.7 g/dL — ABNORMAL LOW (ref 3.4–5.0)
Alkaline Phosphatase: 55 U/L
BILIRUBIN TOTAL: 0.4 mg/dL (ref 0.2–1.0)
BUN: 18 mg/dL (ref 7–18)
CALCIUM: 10.2 mg/dL — AB (ref 8.5–10.1)
CHLORIDE: 103 mmol/L (ref 98–107)
Co2: 31 mmol/L (ref 21–32)
Creatinine: 0.8 mg/dL (ref 0.60–1.30)
EGFR (African American): >60
EGFR (Non-African Amer.): >60
GLUCOSE: 126 mg/dL — AB (ref 65–99)
Osmolality: 285 (ref 275–301)
Potassium: 4 mmol/L (ref 3.5–5.1)
SGOT(AST): 10 U/L — ABNORMAL LOW (ref 15–37)
SGPT (ALT): 11 U/L — ABNORMAL LOW (ref 12–78)
SODIUM: 141 mmol/L (ref 136–145)
TOTAL PROTEIN: 7.3 g/dL (ref 6.4–8.2)

## 2014-01-05 ENCOUNTER — Inpatient Hospital Stay: Payer: Self-pay | Admitting: Internal Medicine

## 2014-01-05 LAB — CBC CANCER CENTER
Basophil #: 0 x10 3/mm (ref 0.0–0.1)
Basophil %: 0.2 %
EOS ABS: 0 x10 3/mm (ref 0.0–0.7)
EOS PCT: 0.1 %
HCT: 30 % — ABNORMAL LOW (ref 40.0–52.0)
HGB: 9.5 g/dL — AB (ref 13.0–18.0)
Lymphocyte #: 0.3 x10 3/mm — ABNORMAL LOW (ref 1.0–3.6)
Lymphocyte %: 3 %
MCH: 31.4 pg (ref 26.0–34.0)
MCHC: 31.8 g/dL — ABNORMAL LOW (ref 32.0–36.0)
MCV: 99 fL (ref 80–100)
MONOS PCT: 5.8 %
Monocyte #: 0.6 x10 3/mm (ref 0.2–1.0)
Neutrophil #: 9.7 x10 3/mm — ABNORMAL HIGH (ref 1.4–6.5)
Neutrophil %: 90.9 %
PLATELETS: 201 x10 3/mm (ref 150–440)
RBC: 3.03 10*6/uL — ABNORMAL LOW (ref 4.40–5.90)
RDW: 14.2 % (ref 11.5–14.5)
WBC: 10.7 x10 3/mm — ABNORMAL HIGH (ref 3.8–10.6)

## 2014-01-05 LAB — COMPREHENSIVE METABOLIC PANEL
ALK PHOS: 58 U/L
ALK PHOS: 54 U/L
ALT: 16 U/L (ref 12–78)
ANION GAP: 3 — AB (ref 7–16)
AST: 17 U/L (ref 15–37)
Albumin: 2.4 g/dL — ABNORMAL LOW (ref 3.4–5.0)
Albumin: 2.2 g/dL — ABNORMAL LOW (ref 3.4–5.0)
Anion Gap: 4 — ABNORMAL LOW (ref 7–16)
BILIRUBIN TOTAL: 0.4 mg/dL (ref 0.2–1.0)
BUN: 19 mg/dL — ABNORMAL HIGH (ref 7–18)
BUN: 20 mg/dL — ABNORMAL HIGH (ref 7–18)
Bilirubin,Total: 0.5 mg/dL (ref 0.2–1.0)
CO2: 33 mmol/L — AB (ref 21–32)
CO2: 30 mmol/L (ref 21–32)
CREATININE: 0.72 mg/dL (ref 0.60–1.30)
Calcium, Total: 9.3 mg/dL (ref 8.5–10.1)
Calcium, Total: 9.3 mg/dL (ref 8.5–10.1)
Chloride: 103 mmol/L (ref 98–107)
Chloride: 104 mmol/L (ref 98–107)
Creatinine: 0.77 mg/dL (ref 0.60–1.30)
EGFR (African American): >60
EGFR (Non-African Amer.): >60
EGFR (Non-African Amer.): >60
GLUCOSE: 123 mg/dL — AB (ref 65–99)
Glucose: 208 mg/dL — ABNORMAL HIGH (ref 65–99)
OSMOLALITY: 283 (ref 275–301)
Osmolality: 283 (ref 275–301)
Potassium: 3.8 mmol/L (ref 3.5–5.1)
Potassium: 3.5 mmol/L (ref 3.5–5.1)
SGOT(AST): 9 U/L — ABNORMAL LOW (ref 15–37)
SGPT (ALT): 10 U/L — ABNORMAL LOW (ref 12–78)
Sodium: 140 mmol/L (ref 136–145)
Sodium: 137 mmol/L (ref 136–145)
TOTAL PROTEIN: 6.2 g/dL — AB (ref 6.4–8.2)
Total Protein: 6.7 g/dL (ref 6.4–8.2)

## 2014-01-05 LAB — APTT: ACTIVATED PTT: 25.6 s (ref 23.6–35.9)

## 2014-01-05 LAB — PROTIME-INR
INR: 1.1
PROTHROMBIN TIME: 14.2 s (ref 11.5–14.7)

## 2014-01-05 LAB — TROPONIN I: Troponin-I: <0.02 ng/mL

## 2014-01-05 LAB — CBC
HCT: 30.8 % — ABNORMAL LOW (ref 40.0–52.0)
HGB: 9.9 g/dL — ABNORMAL LOW (ref 13.0–18.0)
MCH: 32.4 pg (ref 26.0–34.0)
MCHC: 32.1 g/dL (ref 32.0–36.0)
MCV: 101 fL — ABNORMAL HIGH (ref 80–100)
Platelet: 190 10*3/uL (ref 150–440)
RBC: 3.05 10*6/uL — AB (ref 4.40–5.90)
RDW: 14.3 % (ref 11.5–14.5)
WBC: 7.3 10*3/uL (ref 3.8–10.6)

## 2014-01-05 LAB — CK TOTAL AND CKMB (NOT AT ARMC): CK, Total: 13 U/L — ABNORMAL LOW (ref 35–232)

## 2014-01-05 LAB — PRO B NATRIURETIC PEPTIDE: B-Type Natriuretic Peptide: 301 pg/mL — ABNORMAL HIGH (ref 0–125)

## 2014-01-06 LAB — MAGNESIUM: Magnesium: 1.6 mg/dL — ABNORMAL LOW

## 2014-01-06 LAB — CBC WITH DIFFERENTIAL/PLATELET
BASOS ABS: 0 10*3/uL (ref 0.0–0.1)
Basophil %: 0 %
EOS ABS: 0 10*3/uL (ref 0.0–0.7)
EOS PCT: 0 %
HCT: 28.3 % — ABNORMAL LOW (ref 40.0–52.0)
HGB: 9.2 g/dL — ABNORMAL LOW (ref 13.0–18.0)
LYMPHS PCT: 0.9 %
Lymphocyte #: 0.1 10*3/uL — ABNORMAL LOW (ref 1.0–3.6)
MCH: 32.3 pg (ref 26.0–34.0)
MCHC: 32.5 g/dL (ref 32.0–36.0)
MCV: 100 fL (ref 80–100)
MONOS PCT: 2.6 %
Monocyte #: 0.3 x10 3/mm (ref 0.2–1.0)
NEUTROS ABS: 9.4 10*3/uL — AB (ref 1.4–6.5)
NEUTROS PCT: 96.5 %
Platelet: 168 10*3/uL (ref 150–440)
RBC: 2.84 10*6/uL — ABNORMAL LOW (ref 4.40–5.90)
RDW: 14.2 % (ref 11.5–14.5)
WBC: 9.8 10*3/uL (ref 3.8–10.6)

## 2014-01-07 LAB — CBC WITH DIFFERENTIAL/PLATELET
Basophil #: 0 10*3/uL (ref 0.0–0.1)
Basophil %: 0.3 %
Eosinophil #: 0 10*3/uL (ref 0.0–0.7)
Eosinophil %: 0 %
HCT: 26.7 % — ABNORMAL LOW (ref 40.0–52.0)
HGB: 8.6 g/dL — ABNORMAL LOW (ref 13.0–18.0)
LYMPHS PCT: 0.6 %
Lymphocyte #: 0.1 10*3/uL — ABNORMAL LOW (ref 1.0–3.6)
MCH: 32.4 pg (ref 26.0–34.0)
MCHC: 32.4 g/dL (ref 32.0–36.0)
MCV: 100 fL (ref 80–100)
MONO ABS: 0.5 x10 3/mm (ref 0.2–1.0)
Monocyte %: 4.3 %
Neutrophil #: 12.2 10*3/uL — ABNORMAL HIGH (ref 1.4–6.5)
Neutrophil %: 94.8 %
PLATELETS: 148 10*3/uL — AB (ref 150–440)
RBC: 2.67 10*6/uL — AB (ref 4.40–5.90)
RDW: 14 % (ref 11.5–14.5)
WBC: 12.9 10*3/uL — ABNORMAL HIGH (ref 3.8–10.6)

## 2014-01-07 LAB — BASIC METABOLIC PANEL
ANION GAP: 2 — AB (ref 7–16)
BUN: 18 mg/dL (ref 7–18)
CHLORIDE: 107 mmol/L (ref 98–107)
CREATININE: 0.82 mg/dL (ref 0.60–1.30)
Calcium, Total: 9.2 mg/dL (ref 8.5–10.1)
Co2: 31 mmol/L (ref 21–32)
EGFR (African American): >60
GLUCOSE: 114 mg/dL — AB (ref 65–99)
OSMOLALITY: 282 (ref 275–301)
Potassium: 3.9 mmol/L (ref 3.5–5.1)
Sodium: 140 mmol/L (ref 136–145)

## 2014-01-08 LAB — CBC WITH DIFFERENTIAL/PLATELET
BASOS ABS: 0.1 10*3/uL (ref 0.0–0.1)
BASOS PCT: 0.3 %
EOS ABS: 0 10*3/uL (ref 0.0–0.7)
EOS PCT: 0 %
HCT: 27 % — ABNORMAL LOW (ref 40.0–52.0)
HGB: 8.6 g/dL — ABNORMAL LOW (ref 13.0–18.0)
Lymphocyte #: 0.1 10*3/uL — ABNORMAL LOW (ref 1.0–3.6)
Lymphocyte %: 0.6 %
MCH: 31.9 pg (ref 26.0–34.0)
MCHC: 31.9 g/dL — AB (ref 32.0–36.0)
MCV: 100 fL (ref 80–100)
MONO ABS: 0.5 x10 3/mm (ref 0.2–1.0)
MONOS PCT: 3.6 %
NEUTROS PCT: 95.5 %
Neutrophil #: 14.3 10*3/uL — ABNORMAL HIGH (ref 1.4–6.5)
PLATELETS: 153 10*3/uL (ref 150–440)
RBC: 2.71 10*6/uL — ABNORMAL LOW (ref 4.40–5.90)
RDW: 14 % (ref 11.5–14.5)
WBC: 15 10*3/uL — ABNORMAL HIGH (ref 3.8–10.6)

## 2014-01-10 LAB — CULTURE, BLOOD (SINGLE)

## 2014-01-15 ENCOUNTER — Ambulatory Visit: Payer: Self-pay | Admitting: Oncology

## 2014-01-15 ENCOUNTER — Ambulatory Visit: Payer: Self-pay | Admitting: Nurse Practitioner

## 2014-02-12 DEATH — deceased

## 2014-08-01 IMAGING — CT NM PET TUM IMG RESTAG (PS) SKULL BASE T - THIGH
1 of 8 series · 2 of 25 positions shown · non-contrast
Comparison: none

REASON FOR EXAM: Head Neck Restaging
COMMENTS:

PROCEDURE:     PET - PET/CT RESTG HEAD/NECK CA  - October 11, 2013 [DATE]
RESULT:     History: Head and neck cancer.
Comparison Study: Head CT 06/22/2013.

[Series 3: ct headneck 2.0 b31s · axial · 2.0mm · 0.98mm/px · z∈[-514,-474]mm · 2 of 188 slices shown]
[im 134/188  brain]
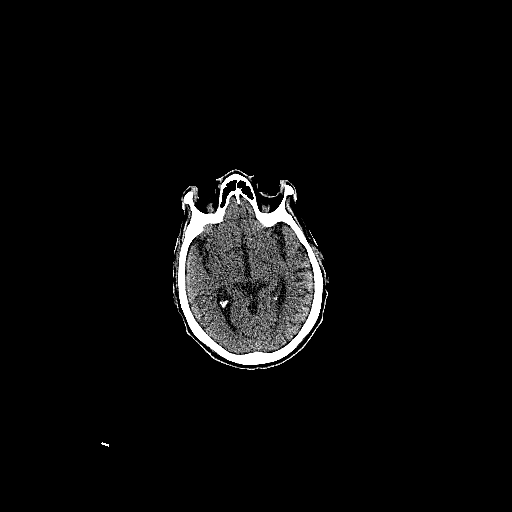
[im 161/188  brain]
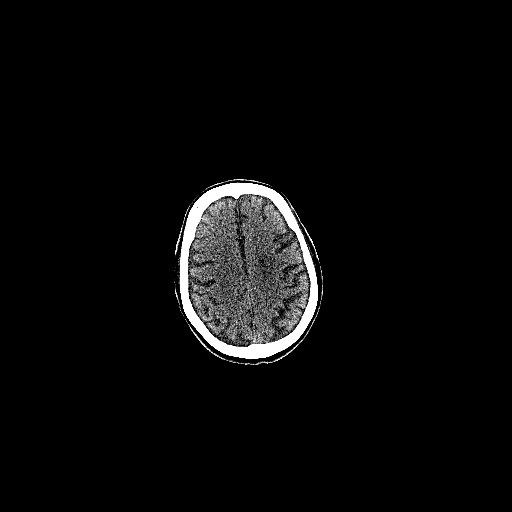

[2 of 25 positions shown; findings below may reference images not displayed]

FINDINGS: Following determination of fasting blood sugar of 83 mg per
deciliter and administration of 12.9 mCi of F-18 FDG, PET CT obtained. CT is
obtained for attenuation correction and fusion. High-resolution neck images
obtained. Previously identified right oral pharyngeal lesion is again noted.
The lesion appears less prominent on today's PET/CT when compared to prior
study a 06/22/2013. Maximum SUV level of 10 noted on today's exam. This is
diminished slightly. Muscular activity noted in neck. No other PET positive
abnormalities identified. Diffuse marrow activity noted in the sternum and
spine, this may be related to prior chemotherapy. Bowel activity is noted.
There is a right inguinal hernia with herniation of colon. No evidence of
bowel obstruction.
IMPRESSION: Interim slight decrease in size and FDG uptake in right
oral pharyngeal tumor. Persistent mass with increased FDG uptake however
does remain.

## 2015-04-06 NOTE — Consult Note (Signed)
patient with recurrent and all persistent head and neck cancer with abscessfeeling better.  Vital signs have been reviewed.hard mass in the right side of the neckto discharge patient on clindamycin.  I will followup with patient regarding  further treatment options for metastatic cancer.  Discussed that with the patient and will make an appointment in   for   1 st week of January  Electronic Signatures: Laddie Aquashoksi, Jocie Meroney K (MD)  (Signed on 23-Dec-14 16:16)  Authored  Last Updated: 23-Dec-14 16:16 by Laddie Aquashoksi, Sarah Baez K (MD)

## 2015-04-06 NOTE — Consult Note (Signed)
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  Swelling has improved. Examination: Patient is alert oriented not in any acute pain no fever.  Lungs clear Cardiac: Normal heart sounds.  Abdomen: Soft.  Liver spleen not palpable.  Examination of head and neck area shows persistent palpable hard mass on the right side of the neck \ All lab data has been reviewed  pLAN:   Continue antibiotics. Reevaluate patient as outpatient to see whether there is any persistent palpable lymph node which may require further treatment. I will follow this patient along with you as well as as outpatient   Electronic Signatures: Shantina Chronister, Gerome SamJanak K (MD)  (Signed 22-Dec-14 12:41)  Authored: Details   Last Updated: 22-Dec-14 12:41 by Laddie Aquashoksi, Deserea Bordley K (MD)

## 2015-04-06 NOTE — Consult Note (Signed)
PATIENT NAME:  George Fisher, George Fisher MR#:  [id number] DATE OF BIRTH:  18-Jun-1941  DATE OF CONSULTATION:  12/02/2013  REFERRING PHYSICIAN:  Dr. Shaune PollackLord.   CONSULTING PHYSICIAN:  Ollen Grossaul S. Willeen CassBennett, MD  REASON FOR CONSULTATION:  Neck swelling.   HISTORY OF PRESENT ILLNESS:  This is a 74 year old male who is [facility] for advanced floor-of-mouth cancer, stage IV (T4 into M0), squamous cell carcinoma. He saw Dr. Doylene Canninghoksi yesterday and their following persistent adenopathy in the neck. He had a CT PET scan done that showed a large mass in the submandibular triangle and submental region at the end of October. He has been treated with chemo and radiation therapy but the only surgery he had was for a biopsy of the floor of mouth primary. It is not clear whether they are looking at palliating the patient at this point or considering [facility]. The family does not indicate there is any discussion of surgery. I have never seen this patient but he did see Dr. Doylene Canninghoksi yesterday for a discussion of further treatment and they were looking at doing some followup imaging. Last night his neck became more swollen and tender. He is not running a fever and does not have an elevated white count.   PAST MEDICAL HISTORY:  Significant for hyperlipidemia and hypertension. 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   SOCIAL HISTORY:  A 40 pack-year history of smoking, denying alcohol use. He is here with his family.   MEDICATIONS: 1.  Metoprolol 25 mg 1 p.o. b.i.d.  2.  Lipitor 40 mg 1 p.o. daily. 3.  Fentanyl 25 mcg per hour once patch transdermal every 72 hours.  4.  Aspirin 81 mg p.o. daily.  5.  Amlodipine 5 mg p.o. daily.  6.  Acetaminophen 1 p.o. every 6 hours.   ALLERGIES:  None.  FAMILY HISTORY:  A history of adult onset diabetes.   REVIEW OF SYSTEMS:    RESPIRATORY:  He is not having difficulty breathing. He does  have chronic dysphagia since his original diagnosis. No nausea, vomiting, rash, chest pain, shortness of breath, fever.  VITAL SIGNS:  Temperature IS 99.8, pulse 81, blood pressure is 132/65, oxygen saturation 100.  GENERAL:  A thin, elderly male, cachectic and in no acute distress.  HEAD AND FACE:  Head is normocephalic, atraumatic with no facial skin lesions.  EARS:  External ears are unremarkable. Ear canals are free of cerumen. Tympanic membranes are clear bilaterally.  NOSE:  External nose unremarkable. The nasal cavity is clear. No purulence or polyps are seen. ORAL CAVITY AND OROPHARYNX:  The tongue is unremarkable but immediately under the tongue at the floor of mouth is a large, erosive defect with exudate. I cannot express any purulence from this area by pressing on the anterior neck. The posterior pharynx is clear without erythema or exudate. He does not have any significant swelling in the floor of mouth, just a large defect from his cancer.  NECK:  The neck is swollen in the submandibular and submental region with areas of fluctuance in the middle of this mass as well as areas of firmness from his persistent lymphadenopathy. The lateral neck is unremarkable with no palpable lymphadenopathy. There is no thyromegaly.   PROCEDURE NOTE:  PREOPERATIVE DIAGNOSIS:  Advanced floor of mouth cancer with submandibular lymphadenopathy and possible abscess.   POSTOPERATIVE DIAGNOSIS:  Advanced floor of mouth cancer with submandibular lymphadenopathy and  possible abscess.   PROCEDURE:  Needle aspiration of submandibular abscess.   SURGEON:  Ollen Gross. Willeen Cass, MD.   ANESTHESIA:  Lidocaine with epinephrine 1:200,000.   DESCRIPTION OF PROCEDURE:  After discussing the procedure with the patient, the skin was prepped with alcohol and injected with 1% lidocaine with epinephrine 1:200,000. An 18-gauge needle was then passed through the soft area in the middle of the submandibular region. About 8 mL of pus  was obtained and sent for a culture. I passed a second needle through an adjacent soft area and only another 1 to 2 mL of pus was obtained. The patient did feel better after the drainage. A 16-gauge IV was placed in the abscess pocket to allow any further egress of purulence.   DATA REVIEW:  I reviewed his CT scan and he has got a matted group of lymph nodes in the submandibular submental region with hyperlucency in the center. His white count is normal at 9.6, although he does have a slightly elevated neutrophil count at 8.9.   ASSESSMENT:  This patient has advanced floor of mouth cancer, metastatic to the submental and submandibular lymph nodes, status post chemotherapy and radiation therapy. He has some secondary infection in these lymph nodes, and I was then able to drain quite a bit purulence. Certainly the goal would be to avoid incision and drainage because of the underlying cancer and concern of spreading this throughout the soft tissues of the neck. I think we can control the infection with IV antibiotics and placement of an IV catheter as described above into the abscess space to allow further drainage. Further aspiration can be considered if necessary. I would like him admitted to the Cancer service as they have been involved in his care and management and will need to discuss with the family his ultimate status as to whether they are at a point of palliative care, or if considering referral to a university hospital for aggressive surgical resection, which would include segmental mandibulectomy, neck dissection and reconstruction. These lymph nodes will clearly not be responding to further nonsurgical management.    Ollen Gross. Willeen Cass, MD psb:jm D: 12/02/2013 12:32:15 ET T: 12/02/2013 12:59:25 ET JOB#: [id number]  cc: Ollen Gross. Willeen Cass, MD, <Dictator> Sandi Mealy MD ELECTRONICALLY SIGNED 12/13/2013 8:01

## 2015-04-06 NOTE — Op Note (Signed)
PATIENT NAME:  George Fisher, George Fisher MR#:  [id number] DATE OF BIRTH:  1941-02-25  DATE OF PROCEDURE:    PREOPERATIVE DIAGNOSIS:  Neck abscess.   POSTOPERATIVE DIAGNOSIS:  Neck abscess.   PROCEDURE PERFORMED:  Incision and drainage of submandibular neck abscess.   SURGEON:  Marion DownerScott Kannon Baum, M.D.   ANESTHESIA:  Local.   DESCRIPTION OF PROCEDURE:  After discussing the procedure with the patient, the area was prepped with Betadine and injected with 1% lidocaine with epinephrine 1:100,000.  A 15 blade was used to incise the skin over the soft fluctuant area where a previous catheter had been placed to drain the wound.  A small amount of purulence was obtained.  Blunt scissors were used to spread into the submandibular space to break up any loculations.  No large loculations of purulence were found at this point, just a small amount of purulent drainage.  A quarter-inch Penrose drain was placed into the depths of the wound and secured with a 3-0 silk suture to the skin.  The wound was then dressed.  Bleeding was minimal.    ____________________________ Ollen GrossPaul S. Willeen CassBennett, MD psb:ea D: 12/03/2013 12:16:50 ET T: 12/03/2013 23:33:02 ET JOB#: [id number]  cc: Ollen GrossPaul S. Willeen CassBennett, MD, <Dictator> Sandi MealyPAUL S Shirl Ludington MD ELECTRONICALLY SIGNED 12/13/2013 8:01

## 2015-04-06 NOTE — Consult Note (Signed)
PATIENT NAME:  George Fisher, George Fisher MR#:  [id number] DATE OF BIRTH:  08/17/1941  DATE OF CONSULTATION:  12/03/2013  REFERRING PHYSICIAN:  Dr. Jacques Navy CONSULTING PHYSICIAN:  Nazariah Cadet R. Sherrlyn Hock, MD  REASON FOR CONSULTATION:  Floor of the mouth cancer.   HISTORY OF PRESENT ILLNESS:  The patient is a 74 year old gentleman with known history of recurrent floor of the mouth cancer, he is followed by Dr. Doylene Canning at [facility] and last saw him on December 3rd. He has stage IVa disease and in the past received 3 cycles of induction chemotherapy followed by radiation chemotherapy, finished 10/02/2013. The patient is now admitted to the hospital with complaints of increasing swelling in the right side of the neck and adjacent jaw area with discomfort. No shortness of breath, difficulty swallowing. States that he continues to eat soft food or full liquid diet by mouth, does not have G-tube. CT scan done showed multiloculated ring enhancing fluid collection in the anterior floor of the mouth consistent with abscess, the patient seen by Dr. Willeen Cass from ENT and abscess drained and currently the patient has tube for drainage. Denies any pain issues at this time.   PAST MEDICAL HISTORY AND PAST SURGICAL HISTORY:  1.  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  2.  Hypertension.  3.  A history of stroke with chronic right-sided weakness.  4.  A history of chronic smoking.   HOME MEDICATIONS:  1.  Norco 325/5 mg 1 tablet q.6 hours. 2.  Amlodipine 5 mg daily.  3.  Fentanyl patch 25 mcg q.72 hours.  4.  Aspirin 81 mg daily.  5.  Lipitor 40 mg daily.  6.  Metoprolol succinate 25 mg daily.   ALLERGIES:  No known drug allergies.   FAMILY HISTORY:  Diabetes and heart disease.   SOCIAL HISTORY:  Chronic smoker. Denies alcohol or recreational drug usage. Has chronic one-sided weakness from stroke.   REVIEW OF SYSTEMS:  CONSTITUTIONAL:  Generalized weakness and fatigue. Currently denies fever. Has weight  loss. No night sweats.  HEENT:  Denies any dizziness, headaches, epistaxis, ear or jaw pain.  CARDIAC:  No angina, palpitation, orthopnea, or PND.  LUNGS:  Has chronic mild cough, which is dry, and dyspnea on exertion. No hemoptysis or chest pain.  GASTROINTESTINAL:  No nausea, vomiting, or diarrhea. No bright blood in stools or melena.  GENITOURINARY:  No dysuria or hematuria.  SKIN:  No new rashes or pruritus.  HEMATOLOGIC:  Denies obvious bleeding symptoms.  MUSCULOSKELETAL:  No new bone pains.  NEUROLOGIC:  Denies new headaches, seizures, or loss of consciousness.  ENDOCRINE:  No polyuria or polydipsia.   PHYSICAL EXAMINATION:  GENERAL:  A weak-looking, poorly nourished individual resting in bed, otherwise alert and oriented x 3, converses appropriately. No acute distress. No icterus. Mild pallor.  VITAL SIGNS:  98.4, 93, 18, 124/73, 92% on room air.  HEENT:  Normocephalic, atraumatic. Extraocular movements intact. A dressing over the right upper neck and adjacent floor of the mouth area with a palpable swelling/mass, which is tender.  CARDIOVASCULAR:  S1, S2, irregular, irregular.  LUNGS:  Lungs show bilateral good air entry, decreased at bases, no rhonchi.  ABDOMEN:  Soft, nontender. No hepatomegaly.  EXTREMITIES:  No edema or cyanosis.  SKIN:  No generalized rashes or major bruising.  MUSCULOSKELETAL:  No obvious joint redness or swelling.   LABORATORY, DIAGNOSTIC, AND RADIOLOGICAL DATA:  WBC 7700, ANC 6900, hemoglobin 9, platelets 176, creatinine 0.6, calcium 9.1. Blood culture negative  so far. Wound culture showing many coccobacillius and rare gram-positive rods.   IMPRESSION AND RECOMMENDATION:  This is a [age] year old gentleman with known history of recurrent stage IVa floor of the mouth cancer status post chemoradiation in the past, completed a few months ago. Now admitted with progressive swelling and found to have multiloculated fluid collection, likely abscess, which has been  drained by ENT and Dr. Willeen CassBennett is following. Agree with ongoing supportive treatment and antibiotic coverage with IV clindamycin. The patient is afebrile and clinically is feeling better. Blood counts shows only mild anemia, otherwise no neutropenia or thrombocytopenia. Agree with ongoing management, Oncology will continue to follow as needed during hospitalization. If discharged soon, the patient advised [facility] Doylene Can[facility]hoksi as already scheduled. He is agreeable to this plan.   Thank you for the referral, please feel free to contact me for additional questions.   ____________________________ Maren ReamerSandeep R. Sherrlyn HockPandit, MD srp:jm D: 12/03/2013 17:10:09 ET T: 12/03/2013 17:49:12 ET JOB#: [id number]  cc: Jamespaul Secrist R. Sherrlyn HockPandit, MD, <Dictator> Wille CelesteSANDEEP R Ciela Mahajan MD ELECTRONICALLY SIGNED 12/04/2013 11:53

## 2015-04-06 NOTE — Op Note (Signed)
PATIENT NAME:  George Fisher, George Fisher MR#:  [id number] DATE OF BIRTH:  07-Nov-1941  DATE OF PROCEDURE:  04/18/2013  PREOPERATIVE DIAGNOSIS: Head and neck cancer with poor venous access and need for durable venous access for chemotherapy.   POSTOPERATIVE DIAGNOSIS:  Head and neck cancer with poor venous access and need for durable venous access for chemotherapy.   PROCEDURES: 1.  Ultrasound guidance for vascular access to left jugular vein.  2.  Fluoroscopic guidance for placement of catheter.  3.  Placement of a CT-compatible Infuse-a-Port, left jugular vein.   SURGEON: Annice NeedyJason S Dew, MD   ANESTHESIA: Local with moderate conscious sedation.   ESTIMATED BLOOD LOSS: 25 mL.   FLUOROSCOPY TIME:  Approximately 2 minutes.   INDICATION FOR PROCEDURE: This is a gentleman who we are asked to place a Port-A-Cath for his mouth cancer that we needed for chemotherapy and durable venous access. The risks and benefits were discussed. Informed consent was obtained.   DESCRIPTION OF PROCEDURE: The patient was brought to the vascular interventional radiology suite. The left neck and chest were sterilely prepped and draped and a sterile surgical field was created. The left jugular vein was visualized with ultrasound and accessed with a mild amount of difficulty with Seldinger needle. We placed a Micropuncture wire and a Micropuncture sheath, and I then upsized to a 0.35 J-wire and a peel-away sheath. I then anesthetized an area under the left clavicle. An inferior pocket was created with electrocautery and blunt dissection. The port was secured to the chest wall with 2 Prolene sutures.  The catheter was tunneled from the subclavicular incision to the access site, was cut to an appropriate length using fluoroscopic guidance.  On initial placement, the catheter tip was turned back upwards, and I had to rewire the catheter and advanced this down into the superior vena cava pointing directly just above the cavoatrial junction  and then removed the wire. The catheter was then connected back to the port with the appropriate connectors. It withdrew blood well and flushed easily with heparinized saline, and the catheter tip was in the superior vena cava in an appropriate orientation. The wound was then irrigated with antibiotic-impregnated saline and closed with a running 3-0 Vicryl and 4-0 Monocryl. The access systems was closed with a single 4-0 Monocryl. Dermabond was placed as a dressing. The patient tolerated the procedure well and was taken to the recovery room in stable condition.   ____________________________ Annice NeedyJason S. Dew, MD jsd:cb D: 04/18/2013 14:23:49 ET T: 04/18/2013 16:03:48 ET JOB#: [id number]  cc: Annice NeedyJason S. Dew, MD, <Dictator> Annice NeedyJASON S DEW MD ELECTRONICALLY SIGNED 04/21/2013 15:52

## 2015-04-06 NOTE — Consult Note (Signed)
Reason for Visit: This 74 year old Male patient presents to the clinic for initial evaluation of  floor of mouth cancer .   Referred by Dr. Gertie Baron.  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  Pathology Report pathology report reviewed   Imaging Report CT scan reviewed PET/CT scan ordered   Referral Report clinical notes reviewed   Planned Treatment Regimen upfront surgery versus IMRT radiation with concurrent chemotherapy   HPI   patient is a 74 year old male who presented with increasing dysphasia weight loss and decreased appetite over several weeks. He was seen in the emergency room and a mass lesion in the floor the mouth was noted with erosion of the lingual cortex of the mandible at the symphysis. There was also submental adenopathy. Entire neck was not imaged at the time of the CT scan. Patient was seen by Dr. Chestine Spore underwent fiberoptic laryngoscopy and squamous cell carcinoma of the floor the mouth wasdiagnosed. Case was presented at our multimodality tumor conference. I options with primary surgery versus concurrent chemotherapy and IMRT radiation therapy were discussed. complete workup including PET/CT scan has not yet been performed. Patient is doing fairly well is having some oral pain and some slight dysphasia.  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   Social History positive   Social History Comments 40-pack-year smoking history. Denies EtOH abuse history   Additional Past Medical and Surgical History  accompanied by wife today   Allergies:   Lisinopril: Unknown  Home Meds:  Home Medications: Medication Instructions Status  aspirin 81 mg oral tablet   once a day  Active  Lipitor 40 mg oral tablet   once a day  Active  metoprolol succinate 25 mg oral tablet, extended release   2 times a day  Active  omeprazole 40 mg oral delayed release capsule 1 cap(s) orally once a day Active  amLODIPine 5 mg oral tablet 1 tab(s) orally once a day Active  traMADol 50 mg oral tablet 1 tab(s) orally every 4 hours, As Needed - for Pain Active  clotrimazole 10 mg oral lozenge 1 lozenge orally 5 times a day Active   Review of Systems:  General negative   Performance Status (ECOG) 1   Skin negative   Breast negative   Ophthalmologic negative   ENMT see HPI   Respiratory and Thorax negative   Cardiovascular negative   Gastrointestinal negative   Genitourinary negative   Musculoskeletal negative   Neurological see HPI   Psychiatric negative   Hematology/Lymphatics negative   Endocrine negative   Allergic/Immunologic negative   Review of Systems   patient walks with a cane assistance secondary to his prior CVA. Otherwise according to nurse's notesPatient denies any weight loss, fatigue, weakness, fever, chills or night sweats. Patient denies any loss of vision, blurred vision. Patient denies any ringing  of the ears or hearing loss. No irregular heartbeat. Patient denies heart murmur or history of fainting. Patient denies any chest pain or pain radiating to her upper extremities. Patient denies any shortness  of breath, difficulty breathing at night, cough or hemoptysis. Patient denies any swelling in the lower legs. Patient denies any nausea vomiting, vomiting of blood, or coffee ground material in the vomitus. Patient denies any stomach pain. Patient states has had normal bowel movements no significant constipation or diarrhea. Patient denies any dysuria, hematuria or significant  nocturia. Patient denies any problems walking, swelling in the joints or loss of balance. Patient denies any skin changes, loss of hair or loss of weight. Patient denies any excessive worrying or anxiety or significant depression. Patient denies any problems with insomnia. Patient denies excessive thirst, polyuria, polydipsia. Patient denies any swollen glands, patient denies easy bruising or easy bleeding. Patient denies any recent infections, allergies or URI. Patient "s visual fields have not changed significantly in recent time.  Nursing Notes:  Nursing Vital Signs and Chemo Nursing Nursing Notes: *CC Vital Signs Flowsheet:   24-Apr-14 09:20  Pulse Pulse 114  Respirations Respirations 18  SBP SBP 167  DBP DBP 90  Current Weight (kg) (kg) 77.1   Physical Exam:  General/Skin/HEENT:  Skin normal   Eyes normal   Additional PE thin well-developed male using a cane for walking assistance with obvious right-sided weakness. Oral cavity shows teeth in poor state of repair. He has a fungating mass in the anterior floor of the mouth with fixation to the mandible. There was also prominent submental adenopathy also right sub-digastric and right cervical adenopathy Indirect mirror examination shows upper airway clear vallecula and base of tongue within normal limits. Lungs are clear to A&P cardiac examination shows regular rate and rhythm. No supraclavicular adenopathy is appreciated.   Breasts/Resp/CV/GI/GU:  Respiratory and Thorax normal   Cardiovascular normal   Gastrointestinal normal   Genitourinary normal   MS/Neuro/Psych/Lymph:  Musculoskeletal normal   Lymphatics normal   Physical Exam patient has subtle right lower extremity weakness and uses a cane for walking assistance   Other Results:  Radiology Results: LabUnknown:    13-Apr-14 17:25, CT Neck With Contrast  PACS Image   CT:  CT Neck With Contrast   REASON FOR EXAM:    palpable mass under right mandible  COMMENTS:        PROCEDURE: CT  - CT NECK WITH CONTRAST  - Mar 27 2013  5:25PM     RESULT: Comparison: None    Technique: Multiple sequential axial images from the apices of the lungs   to the level of the orbits obtained with 75 mL Isovue 300 IV contrast.    Findings:    There is an ill-defined mass along the anterior base of the tongue. It is   inseparable from the tongue. There is destruction of the adjacent   symphysis of the mandible. Multiple mildly enlarged submental lymph nodes   are demonstrated. The mass causes posterior displacement of the tongue     and narrowing of the airway at the base of the oropharynx. The right   measures 7 mm    There is lacunar infarct in the left corona radiata.  Small, 10 mm   irregular nodular opacity in the left lung apex is similar to the chest   CT of 01/25/2012 and may be secondary to scarring. Other subpleural   opacities are similar to prior.    There is circumferential thickening of the thoracic trachea. This is   similar to prior.    IMPRESSION:   1. Large ill-defined mass along the anterior base of the tongue,   concerning for malignancy. The  mass appears to involve the symphysis of   the mandible. The mass causes posterior displacement of the common     narrowing of the airway.  2. Mildly enlarged submental lymph nodes are nonspecific. Metastatic   disease is not excluded.  3. Small spiculated nodular density at the left lung apex is similar to   prior and may be secondary to scarring. However, continued followup is   recommended to ensure stability.  4. Circumferential thickening of the thoracic trachea is nonspecific, but   similar to prior. This can be seen with autoimmune disorders, among other   causes. Malignancy is not excluded.      Dictation Site: 8        Verified By: Lewie ChamberOBERT L. 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 Certainly believe surgeon this patient would be significantly disfiguring including tongue neck and jaw to achieve clear margins. I believe an alternative would be to do induction chemotherapy followed by concurrent chemotherapy and IMRT radiation therapy up to 7000 cGy to the area of primary tumor involvement and you surgery for salvage. I'd also like to do a and have ordered a PET/CT scan to complete his staging workup. I've discussed the case personally with Dr. Doylene Canninghoksi who will be also seeing him today in consultation. We'll also have a dental evaluation for possibility of teeth extraction of the next several weeks . Further recommendations and based on findings of PET/CT scan will also be discussed once they're available.  I would like to take this opportunity to thank you for allowing me to continue to participate in this patient's care.  CC Referral:  cc: Dr. Gertie BaronMadison Clark   Electronic Signatures: Rushie Chestnuthrystal, Gordy CouncilmanGlenn S (MD)  (Signed 24-Apr-14 11:08)  Authored: HPI, Diagnosis, Past Hx, PFSH, Allergies, Home Meds, ROS, Nursing Notes, Physical Exam, Other Results, Relevent Results, Encounter Assessment and Plan, CC Referring Physician   Last Updated: 24-Apr-14 11:08 by Rebeca Alerthrystal, Kjirsten Bloodgood S (MD)

## 2015-04-07 NOTE — Consult Note (Signed)
   Comments   I met with pt's son, Legrand Como, and Reserve wife, Mardene Celeste. They informed pt's other 5 children of family meeting today but Legrand Como is the only one who will be here. Pt is sitting up in bed, awake, off bipap with 02sat (by me) 94%. Therefore, pt was able to participate in discussion. Pt understands that there are no further tx options for him. We discussed the options of SNF with hospice vs Hospice Home. Pt has been to [facility] in the past and had a good experience. This is also convenient to family. Pt is agreeable to return there if possible with hospice following. Discussed with CM.   Electronic Signatures: Chaniyah Jahr, Izora Gala (MD)  (Signed 26-Jan-15 10:28)  Authored: Palliative Care   Last Updated: 26-Jan-15 10:28 by Kevyn Boquet, Izora Gala (MD)

## 2015-04-07 NOTE — H&P (Signed)
PATIENT NAME:  George Fisher, George Fisher MR#:  [id number] DATE OF BIRTH:  [birth year]  DATE OF ADMISSION:  01/05/2014  PRIMARY CARE PHYSICIAN:  Dr. Marvis MoellerMiles.  REFERRING PHYSICIAN:  Dr. Fanny BienQuale.  CHIEF COMPLAINT: Shortness of breath and cough for 1 day.   HISTORY OF PRESENT ILLNESS: A 74 year old African American male with a history of head and neck cancer, hypertension, stroke, [facility] to shortness of breath, cough of 1 day, and got hypoxia in Cancer [facility]. Actually, the patient has had a cough for 1 day. The patient got chemotherapy infusion in Cancer Center, but was noted to develop hypoxia with O2 saturation at 70s, so the patient was sent to ED for further evaluation. The patient denies any fever or chills. No headache or dizziness. Denies any nausea, vomiting or diarrhea. No hemoptysis. The patient   DICTATION ENDS HERE.    ____________________________ George PollackQing Greydon Betke, MD qc:dmm D: 01/05/2014 19:18:20 ET T: 01/05/2014 19:47:57 ET JOB#: [id number]  cc: George PollackQing Shaiann Mcmanamon, MD, <Dictator> George PollackQING Berman Grainger MD ELECTRONICALLY SIGNED 01/06/2014 13:00

## 2015-04-07 NOTE — H&P (Signed)
PATIENT NAME:  George Fisher, George Fisher MR#:  [id number] DATE OF BIRTH:  06/29/1941  DATE OF ADMISSION:  12/02/2013  PRIMARY CARE PHYSICIAN: Dr. Darreld Mclean   PRIMARY ONCOLOGIST:  Dr. Doylene Canning.  REFERRING PHYSICIAN: Dr. Shaune Pollack.   CHIEF COMPLAINT: Swelling in the right side of the neck.   HISTORY OF PRESENT ILLNESS: The patient is a 74 year old male with a history of stage IV cancer of the floor of the mouth who has had chemo and radiation and finished in August, but per family, it has come back and he is supposed to resume treatment in January. The patient presents after experiencing progressive swelling in the right side of the neck. Of note, the patient was seen by Oncology earlier in the month and was given some steroids. That has not resulted in any significant improvements and today the patient woke up with significantly worse swelling. The patient denies having any shortness of breath or difficulty swallowing. However, a CAT scan was done here given the history of cancer because of the swelling. A multiloculated ring-enhancing fluid collection in the anterior floor of the mouth was seen consistent with abscess. The patient was seen by Dr. Willeen Cass from ENT.  The abscess was drained and currently the patient has a tube for further drainage. Were are asked to admit the patient for IV antibiotics and further management.   PAST MEDICAL HISTORY:  1.  History of stage IV-A squamous cell carcinoma of the floor of the mouth.  2.  Hypertension.  3.  History of stroke with chronic right-sided weakness.  4.  Ongoing tobacco abuse.   ALLERGIES: Denies.   OUTPATIENT MEDICATIONS: Acetaminophen/hydrocodone 325/5 mg 1 tab every 6 hours, amlodipine 5 mg daily, aspirin 81 mg daily, fentanyl patch 25 mcg every 3 days, Lipitor 40 mg daily, metoprolol succinate 25 mg daily.   FAMILY HISTORY: Diabetes and CAD.   PAST SURGICAL HISTORY: Denies.   SOCIAL HISTORY: Quit smoking in the past, but recently started after finishing  chemotherapy and radiation. Denies alcohol or drug use.   REVIEW OF SYSTEMS:  CONSTITUTIONAL: Denies fever, but has had significant weight loss.  EYES: No blurry vision or double vision.  ENT: Denies tinnitus or hearing loss.  RESPIRATORY: Denies cough, wheezing, shortness of breath or COPD. ENT: Increased swelling and pain on the right-side of the neck.  Denies difficulty swallowing, but has had some pain swallowing.  CARDIOVASCULAR: No chest pain or swelling in the legs. No history of recent MI or CHF.   GASTROINTESTINAL: No nausea, vomiting, diarrhea, abdominal pain, bloody stools or dark stools.  GENITOURINARY: Denies dysuria or hematuria.  HEMATOLOGIC AND LYMPHATICS:  Denies anemia or easy bruising.  SKIN: Denies any rashes.  MUSCULOSKELETAL: Denies arthritis or gout.  NEUROLOGIC:  History of stroke. PSYCHIATRIC:  Denies anxiety or insomnia.   PHYSICAL EXAMINATION: VITAL SIGNS: Temperature on arrival 99.8, pulse rate 112, respiratory rate 20, blood pressure 118/66, O2 sat 98% on room air.  GENERAL: The patient is a thin male lying in bed, no obvious distress. The head is bandaged.  HEENT: Normocephalic, atraumatic. Pupils are equal and reactive. Anicteric sclerae. Extraocular muscles intact. Moist mucous membranes. There appears to be some exudate and  malodorousness at the floor of the mouth and some swelling in the right side of the neck without any evidence for significant cellulitis at this point. The patient also does have a tube currently inserted. No significant drainage.  NECK: Supple. Otherwise no tender lymphadenopathy.  CARDIOVASCULAR: S1, S2, irregularly irregular. No murmurs,  rubs or gallops.  LUNGS: Clear to auscultation.  ABDOMEN: Soft, nontender, nondistended. Positive bowel sounds.  EXTREMITIES: No significant lower extremity edema.  SKIN: No obvious rashes otherwise.  NEUROLOGIC: Cranial nerves II through XII grossly intact. Strength is 5/5 in all extremities.  Sensation is intact to light touch.  PSYCHIATRIC: Awake, alert, oriented, pleasant, cooperative.   LABORATORY AND IMAGING DATA:  CT of the head without contrast as above. CT of the neck with contrast also shows increased submandibular duct dilation bilaterally, possible distal compression by the collection. Glucose 118, BUN 11, creatinine 0.73, sodium 138, potassium 4, white count of 9.6, hemoglobin 10, platelets 189.   ASSESSMENT AND PLAN: We have a 74 year old with stage IV squamous cell carcinoma of the floor of the mouth who has finished chemotherapy and radiation in the past, but is going to be resumed on therapy in January. ongoing tobacco abuse, hypertension, who presents with abscess of the floor of the mouth and submandibular region. This has been drained locally by Dr. Willeen CassBennett from ENT. Would admit the patient. Cultures have been sent already. Would start the patient on clindamycin 900 mg q.8 hours per ENT's recommendations. IV fluids, Tylenol and pain control. Follow with the cultures. I would consider repeating the CAT scan in a couple of days or so, depending on how the drainage goes and follow with ENT's recommendations which we would appreciate. Would also order blood cultures. The patient does appear to have tachycardia and low grade fevers with suspected sepsis as I think the patient likely does have systemic inflammatory response syndrome criteria with low-grade temperatures and tachycardia. Would start the patient on some Tylenol p.r.n. for fevers. See what the cultures show. A source of infection is the documented abscess on the CT. I would obtain Oncology consult as well. Would continue the blood pressure medications as well as the Lipitor. Obtain a swallow evaluation formally and start the patient on clears for now in addition to IV fluids. We would also start him on his Fentanyl patch for pain as well as nicotine patch. He was counseled for greater than 3 minutes about his ongoing tobacco  abuse and he was agreeable to a nicotine patch. He is a DO NOT RESUSCITATE per his wishes.   TOTAL TIME SPENT: 50 minutes.    ____________________________ Krystal EatonShayiq Deke Tilghman, MD sa:dp D: 12/02/2013 13:29:00 ET T: 12/02/2013 13:52:10 ET JOB#: [id number]  cc: Krystal EatonShayiq Farmer Mccahill, MD, <Dictator> Gerome SamJanak K. Doylene Canninghoksi, MD Leanna SatoLinda M. Miles, MD  Marcelle SmilingSHAYIQ [facility] Hospital[facility]ADZIA MD ELECTRONICALLY SIGNED 12/27/2013 11:10

## 2015-04-07 NOTE — Consult Note (Signed)
   Comments   I met with pt's sons Legrand Como and Elta Guadeloupe and daughter-in-law, Mardene Celeste. Updated them on pt's current condition. Family confirm that pt has been declining and understand that he will not be a candidate for further chemotherapy. Pt has been living with Legrand Como and Mardene Celeste but they do not feel that they can care for him adequately. They would like for pt to go to SNF with hospice (pt has both Medicare & Medicaid) or to the Hospice Home, depending on how he recovers from this acute illness.  does not have a HCPOA and the family I met with are very clear that any decisions will be made jointly by all 6 of pt's children. They do not think that family can all be at hospital today so asked that I meet with them on Monday AM to decide discharge plan.  confirm that pt is a DNR. They do not think he understood when he changed his code status yesterday. Daughter-in-law asked him today if he wanted to be resuscitated and he said no. Will change code status.   Electronic Signatures: Ghadeer Kastelic, Izora Gala (MD)  (Signed 23-Jan-15 12:29)  Authored: Palliative Care   Last Updated: 23-Jan-15 12:29 by Kile Kabler, Izora Gala (MD)

## 2015-04-07 NOTE — H&P (Signed)
PATIENT NAME:  George Fisher, George Fisher MR#:  [id number] DATE OF BIRTH:  Aug 15, 1941  DATE OF ADMISSION:  01/05/2014  PRIMARY CARE PHYSICIAN: Dr. Marvis Moeller.  REFERRING PHYSICIAN:  Dr. Fanny Bien.  HISTORY OF PRESENT ILLNESS: A 74 year old African American male with a history of neck and head cancer, anemia, hypertension, was sent to ED due to hypoxia from [facility]. The patient is alert, awake, oriented. The patient said he has had a cough and shortness of breath since yesterday. The patient got chemotherapy today. He developed hypoxia with O2 saturation of the 70s, so the patient was sent to the ED for further evaluation. The patient denies any fever or chills, but has cough, sputum, shortness of breath. Denies any hemoptysis. The patient said he recently lost weight of about 5 pounds for the past 1 week. The patient also has weakness, but denies any chest pain, palpitation, orthopnea or nocturnal dyspnea. No leg edema.   The patient's chest x-ray showed right-sided infiltrate. The patient was treated with vanco in the ED.     SOCIAL HISTORY: The patient quit smoking a long time ago. Denies any alcohol drinking or illicit drugs.   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   ALLERGIES: No.   FAMILY HISTORY: Diabetes and CAD.   PAST SURGICAL HISTORY:  Head and neck abscess drainage.   HOME MEDICATIONS:  1.  Prednisone 20 mg p.o. daily. 2.  Lopressor 25 mg p.o. b.i.d.  3.  Lipitor 40 mg p.o. at bedtime. 4.  Lactobacillus acidophilus 1 cap once a day.  5.  Doxycycline 100 mg p.o. b.i.d.  6.  Aspirin 81 mg p.o. once a day.  7.  Norvasc 5 mg p.o. daily.  8.  Acetaminophen/hydrocodone 325 mg/5 mg p.o. every 6 hours p.r.n.     REVIEW OF SYSTEMS:  CONSTITUTIONAL: The patient denies any fever, chills. No headache or dizziness, but has weakness.  EYES: No double vision or blurred vision.  ENT: No postnasal drip, slurred  speech or dysphagia.  CARDIOVASCULAR: No chest pain, palpitations. No orthopnea. No nocturnal dyspnea. No leg edema.  PULMONARY: Positive for cough, sputum, shortness of breath, but no hemoptysis.  GASTROINTESTINAL: No abdominal pain, nausea, vomiting, diarrhea. No melena or bloody stool.  GENITOURINARY: No dysuria, hematuria or incontinence.  SKIN: No rash or jaundice.  NEUROLOGY: No syncope, loss of consciousness or seizure.  HEMATOLOGY: No easy bruising or bleeding.  ENDOCRINOLOGY: No polyuria, polydipsia, heat or cold intolerance.   PHYSICAL EXAMINATION: VITAL SIGNS: Temperature 97.8, blood pressure 94/63, pulse 106, O2 saturation 67% on nonrebreather treatment.  GENERAL: The patient is alert, awake, oriented, in no acute distress, but on nonrebreather oxygen.  HEENT: Pupils are round, equal and reactive to light and accommodation.   NECK: Supple. No JVD or carotid bruits, but has lymphadenopathy on both sides with mass. No thyromegaly.   CARDIOVASCULAR: S1, S2, regular rate and rhythm. No murmurs or gallops.  PULMONARY: Bilateral limited air entry, very weak breath sounds, but no wheezing with mild crackles on the right side.  ABDOMEN: Soft. No distention or tenderness. No organomegaly. Bowel sounds present.  EXTREMITIES: No edema, clubbing or cyanosis. No calf tenderness. Bilateral pedal pulses present.  SKIN: No rash or jaundice.  NEUROLOGIC: A and O x 3. No focal deficit. Power 3 to 4 out of 5. Sensation intact.   LABORATORY, DIAGNOSTIC AND RADIOLOGICAL DATA: Chest x-ray showed new-onset right perihilar infiltrate and/or mass, and right lower lobe  infiltrate with right lower lobe atelectasis.   CT angio showed no PE, but abnormal tracheal thickening with frothy material filling part of the distal trachea.   Emphysema. Destructive lesion of the mandible with surrounding gas and edema in the soft tissue. For details, please refer to the detailed report.   Troponin less than 0.02, CK  13, CK-MB less than 0.5. INR 1.1. WBC 7.3, hemoglobin 9.9, platelets 190.   Glucose 208, BUN 20, creatinine 0.72. Electrolytes are normal.   BNP is 301.   ABG showed pH of 7.45, pCO2 of 43, pO2 less than 43.  Lactate 1.6.   IMPRESSIONS: 1.  Acute respiratory failure.  2.  Pneumonia.  3.  Dehydration.  4.  Anemia.  5.  Advanced head and neck cancer stage IV.    PLAN OF TREATMENT: 1.  The patient was initially admitted to the medical floor; however, since the patient's ABG showed acute respiratory failure with a pO2 of less than 43, pCO2 of 43, the patient also wants FULL CODE, we will transfer the patient to CCU and give BiPAP. Three of our medical staff asked the patient's CODE STATUS. The patient wants FULL CODE, but it appears the patient was in DNR status. Dr. Doylene Canninghoksi will see the patient to make sure of the patient's code status.  2. For pneumonia, we will restart Zosyn, Levaquin and vancomycin and follow up CBC, blood culture, sputum culture.  3.  Also, we will give nebulizer treatment.  4.  For dehydration, the patient will be treated with normal saline IV and follow up BMP.   I discussed the patient's critical condition with the patient, the patient's daughter, son and brothers. The patient wants FULL CODE.   TIME SPENT: About 68 minutes.    ____________________________ Shaune PollackQing Lakely Elmendorf, MD qc:dmm D: 01/05/2014 19:35:25 ET T: 01/05/2014 19:52:40 ET JOB#: [id number]  cc: Shaune PollackQing Gabriele Loveland, MD, <Dictator> Shaune PollackQING Pearlean Sabina MD ELECTRONICALLY SIGNED 01/06/2014 13:07

## 2015-04-07 NOTE — Consult Note (Signed)
History of Present Illness:  Reason for Consult Chief Complaint/Diagnosis:  1. Carcinoma of floor of the mouth involving the mandibleT4NX M0 tumor. Based on PET scan tumor is T4, N2, M0 tumor stage IVa. 2.induction chemotherapy cycle 3. 4.  Radiation chemotherapy starting from July 11, 2013 5.  Patient has finished chemotherapy as well as radiation therapy October 02, 2013 6.progressing disease and patient developing acute respiratory failure after receiving Benadryl and cetuximab   HPI   Patient is known to have stage IV carcinoma of head and neck.  Patient's condition has declined rapidly.  Has lost significant weight since last evaluation.  The soft unknown after receiving better to intravenous recent blood pressure dropped  and become slightly hypoxic.  Chest x-ray revealed pneumonia.  CT scan was negative for any pulmonary embolism.  Initially patient and family agreed for no intubation but the supportive therapy.  But later on as far nurses feedback IVC before recall that patient wants to be intubated.    started on vancomycin  and zosyn.  PFSH:  Additional Past Medical and Surgical History Past Hx:  ??  HTN:  ??  CVA:  ??  Denies medical history:   Past, Family and Social History: ?? Past Medical History positive ?? Cardiovascular hyperlipidemia; hypertension ?? Neurological/Psychiatric CVA ?? Past Medical History Comments CVA with chronic right lower extremity weakness ?? Family History positive ?? Family History Comments family history of adult onset diabetes and coronary artery disease. ?? Social History positive ?? Social History Comments 40-pack-year smoking history. Denies EtOH abuse history.  According to patient has stopped smoking 5 months ago ?? Additional Past Medical and Surgical History accompanied by wife today   Review of Systems:  General weakness  fatigue   Performance Status (ECOG) 2   HEENT swellng   Lungs cough  SOB   Cardiac chest pain   GI no  complaints   GU no complaints   Musculoskeletal no complaints   Extremities no complaints   Skin no complaints   Neuro no complaints   Endocrine no complaints   Psych anxiety   NURSING NOTES: Transfer Form (Outpatient in Facility):   22-Jan-15 18:40   Transfer To: 1C   By: Stretcher   Handoff Information Reviewed Via: Clinical Summary Screen   Verbal Report Given By: Rojelio Brenner, RN   Verbal Report Received By: Maudie Mercury, RN  **Vital Signs.:   22-Jan-15 18:36   Vital Signs Type: Admission   Temperature Temperature (F): 97.8   Celsius: 36.5   Temperature Source: axillary   Pulse Pulse: 106   Respirations Respirations: 20   Systolic BP Systolic BP: 93   Diastolic BP (mmHg) Diastolic BP (mmHg): 65   Mean BP: 74   Pulse Ox % Pulse Ox %: 67   Oxygen Delivery: Non Rebreather Mask   Physical Exam:  General patient is cachectic in mild distress   HEENT: palpable mass on right side of the neck   Lungs: rales  crepitations   Cardiac: tachycardia   Abdomen: soft  nontender  positive bowel sounds   Skin: intact   Musculoskeletal: swollen/deformed joints   Extremities: edema   Neuro: AAOx3  cranial nerves intact   Physical Exam palpable mass in the lymph node in the right side of the    No Known Allergies:     predniSONE 20 mg oral tablet: 1 tab(s) orally once a day for 15 days, Status: Active, Quantity: 15, Refills: None   doxycycline hyclate 100 mg oral capsule: 1 cap(s)  orally 2 times a day for 15 days, Status: Active, Quantity: 30, Refills: None   amLODIPine 5 mg oral tablet: 1 tab(s) orally once a day, Status: Active, Quantity: 0, Refills: None   acetaminophen-HYDROcodone 325 mg-5 mg oral tablet: 1 tab(s) orally every 6 hours, As Needed - for Pain, Status: Active, Quantity: 60, Refills: None   Lipitor 40 mg oral tablet: 1 tab(s) orally once a day (at bedtime), Status: Active, Quantity: 0, Refills: None   metoprolol succinate 25 mg oral tablet,  extended release: 1 tab(s) orally 2 times a day, Status: Active, Quantity: 0, Refills: None   lactobacillus acidophilus - oral capsule: 1 cap(s) orally once a day, Status: Active, Quantity: 30, Refills: None   aspirin 81 mg oral tablet: 1 tab(s) orally once a day, Status: Active, Quantity: 0, Refills: None  Laboratory Results: Hepatic:  22-Jan-15 14:48   Bilirubin, Total 0.5  Alkaline Phosphatase 54 (45-117 NOTE: New Reference Range 11/04/13)  SGPT (ALT) 16  SGOT (AST) 17  Total Protein, Serum  6.2  Albumin, Serum  2.2  Routine Chem:  22-Jan-15 14:48   Glucose, Serum  208  BUN  20  Creatinine (comp) 0.72  Sodium, Serum 137  Potassium, Serum 3.5  Chloride, Serum 104  CO2, Serum 30  Calcium (Total), Serum 9.3  Osmolality (calc) 283  eGFR (African American) >60  eGFR (Non-African American) >60 (eGFR values <63m/min/1.73 m2 may be an indication of chronic kidney disease (CKD). Calculated eGFR is useful in patients with stable renal function. The eGFR calculation will not be reliable in acutely ill patients when serum creatinine is changing rapidly. It is not useful in  patients on dialysis. The eGFR calculation may not be applicable to patients at the low and high extremes of body sizes, pregnant women, and vegetarians.)  Anion Gap  3  B-Type Natriuretic Peptide ([facility])  301 (Result(s) reported on 05 Jan 2014 at 03:30PM.)  Cardiac:  22-Jan-15 14:48   Troponin I < 0.02 (0.00-0.05 0.05 ng/mL or less: NEGATIVE  Repeat testing in 3-6 hrs  if clinically indicated. >0.05 ng/mL: POTENTIAL  MYOCARDIAL INJURY. Repeat  testing in 3-6 hrs if  clinically indicated. NOTE: An increase or decrease  of 30% or more on serial  testing suggests a  clinically important change)  CK, Total  13  CPK-MB, Serum  < 0.5 (Result(s) reported on 05 Jan 2014 at 03:30PM.)  Routine Coag:  22-Jan-15 14:48   Prothrombin 14.2  INR 1.1 (INR reference interval applies to patients on anticoagulant  therapy. A single INR therapeutic range for coumarins is not optimal for all indications; however, the suggested range for most indications is 2.0 - 3.0. Exceptions to the INR Reference Range may include: Prosthetic heart valves, acute myocardial infarction, prevention of myocardial infarction, and combinations of aspirin and anticoagulant. The need for a higher or lower target INR must be assessed individually. Reference: The Pharmacology and Management of the Vitamin K  antagonists: the seventh ACCP Conference on Antithrombotic and Thrombolytic Therapy. CKLKJZ.7915Sept:126 (3suppl): [id number] A HCT value >55% may artifactually increase the PT.  In one study,  the increase was an average of 25%. Reference:  "Effect on Routine and Special Coagulation Testing Values of Citrate Anticoagulant Adjustment in Patients with High HCT Values." American Journal of Clinical Pathology 2006;126:400-405.)  Activated PTT (APTT) 25.6 (A HCT value >55% may artifactually increase the APTT. In one study, the increase was an average of 19%. Reference: "Effect on Routine and Special Coagulation Testing Values of Citrate  Anticoagulant Adjustment in Patients with High HCT Values." American Journal of Clinical Pathology 2006;126:400-405.)  Routine Hem:  22-Jan-15 14:48   WBC (CBC) 7.3  RBC (CBC)  3.05  Hemoglobin (CBC)  9.9  Hematocrit (CBC)  30.8  Platelet Count (CBC) 190 (Result(s) reported on 05 Jan 2014 at 03:16PM.)  MCV  101  MCH 32.4  MCHC 32.1  RDW 14.3   Radiology Results: XRay:    22-Jan-15 15:11, Chest Portable Single View  Chest Portable Single View   REASON FOR EXAM:    Chest Pain  COMMENTS:       PROCEDURE: DXR - DXR PORTABLE CHEST SINGLE VIEW  - Jan 05 2014  3:11PM     CLINICAL DATA:  Chest pain and shortness of breath.    EXAM:  PORTABLE CHEST - 1 VIEW    COMPARISON:  Chest CT 01/25/2012. Chest x-ray 01/25/2012.  Port-A-Cath noted in good anatomic  position.    FINDINGS:  New onset of right lower lobe atelectasis with infiltrate. Right  perihilar infiltrate and/or mass is present. Volume loss in the  right lower lobe is present. There is shift of the mediastinum and  heart to the right. No pleural effusion or pneumothorax noted. The  heart size and pulmonary vascularity are normal. No acute osseous  abnormality. Degenerative changes thoracic spine. Old right lower  posterior rib fractures are present.     IMPRESSION:  1. New onset right perihilar infiltrate and/or mass and right lower  lobe infiltrate with right lower lobe atelectasis.    2. Port-A-Cath in good position.      Electronically Signed    By: Marcello Moores  Register    On: 01/05/2014 15:28         Verified By: Osa Craver, M.D., MD  LabUnknown:  PACS Image     22-Jan-15 17:39, CT [facility] Chest with for PE  PACS Image    Assessment and Plan: Impression:   1. 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 2.pneumonia most likely aspiration pneumonia Acute respiratory failure severe malnutition  due to malignancy and poor oral intake.  Plan:     agree with  intravenous antibiotic and bronchodilator therapyhas rvery poor prognosis family was in agreement with no aggressive measures  however patient is a changed his mind and wanted to be full code (as  per nurses feedback )with palliative care consultintravenous steroid  Electronic Signatures: Jobe Gibbon (MD)  (Signed 22-Jan-15 19:45)  Authored: HISTORY OF PRESENT ILLNESS, PFSH, ROS, NURSING NOTES, PE, ALLERGIES, HOME MEDICATIONS, LABS, OTHER RESULTS, ASSESSMENT AND PLAN   Last Updated: 22-Jan-15 19:45 by Jobe Gibbon (MD)

## 2015-04-07 NOTE — Discharge Summary (Signed)
PATIENT NAME:  George Fisher, George Fisher MR#:  [id number] DATE OF BIRTH:  06/10/1941  DATE OF ADMISSION:  01/05/2014 DATE OF DISCHARGE:  01/09/2014  ADMITTING DIAGNOSIS:  Acute respiratory failure due to pneumonia.   DISCHARGE DIAGNOSES: 1.  Acute respiratory failure.  2.  Right lower lobe pneumonitis, suspected aspiration.  3. 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 4.  Hypomagnesemia  5.  Anemia of chronic disease.  6.  History of stroke with right-sided weakness.  7.  History of hypertension, hypertensive during this admission, likely dehydration-related.   DISCHARGE CONDITION: Stable, guarded.     MEDICATIONS: The patient is to resume his outpatient medications which are acetaminophen/hydrocodone 325/5 mg 1 tablet every 6 hours as needed, Lipitor 40 mg p.o. at bedtime, Lactobacillus 1 capsule once daily. New medications:  1.  Prednisone 50 mg p.o. once on the 01/10/2014, then taper by 10 mg daily until stopped.  2.  Albuterol 2.5 mg/3 mL 6 times daily as needed.  3.  Levofloxacin 750 mg p.o. daily for 7 more days.  4.  Amoxicillin clavulanate  875/125 mg 1 tablet every 12 hours for 10 days.   HOME OXYGEN: With portable tank at 2 liters of oxygen per nasal cannula. The patient was also recommended to have BiPAP at night as needed with backup rate of 10, inspiratory pressure of 15, expiratory pressure of 8 and titrate saturation of 92%.   DIET: Two-gram salt, low fat, low cholesterol with Ensure supplements 3 times daily as needed. Diet consistency would be pureed.   ACTIVITY LIMITATIONS: As tolerated.   Referrals to physical therapy, as well as speech therapy.     FOLLOWUP APPOINTMENT: With Dr. Darreld Mclean in 2 days after discharge, Dr. Doylene Canning in 1 week after discharge.    CONSULTANTS: Care management, social work, Dr. Harvie Junior, Dr. Doylene Canning.   RADIOLOGIC STUDIES: Chest x-ray, portable single view, 01/10/2014, showed new onset of right  perihilar infiltrate and/or mass and right lower lobe infiltrate with right lower lobe atelectasis. Port-A-Cath was seen in good position. CT scan of chest with IV contrast for pulmonary embolism protocol revealed no pulmonary embolus, abnormal tracheal thickening with frothy material filling part of the distal trachea and filling the right mainstem bronchus and tracking through the right tracheobronchial tree. This could represent secretions or fungal infection of tracheobronchial tree on the right. There is associated volume loss on the right. Emphysema. Destructive lesion of the mandible with surrounding gas and edema noted in the soft tissues. Complex cystic lesion in the right kidney upper pole with increasing dependent density compared to prior PET CT scan. Malignancy is not excluded, although this could simply represent blood products, according to the radiologist.   The patient is a 74 year old African American male with past medical history significant for a history of stage IV  head and neck carcinoma with mandible involvement, who presents to the hospital with complaints of shortness of breath and hypoxia. His O2 sats went down to 70s, and he was sent to the Emergency Room for further evaluation from chemotherapy. On arrival to the Emergency Room, his temperature was 97.8, pulse was 106, blood pressure was 94/63, saturation was 67% on nonrebreather. The patient had very limited air entry bilaterally, worse  on the right side, and crackles were noted also on the right side. Lab data done in the Emergency Room showed elevation of beta-type natriuretic peptide of 301, glucose of 208, BUN of 20, otherwise BMP was unremarkable. The patient's  magnesium level was also found to be low at 1.6. Albumin level was low at 2.2. Otherwise, liver enzymes were normal. Cardiac enzymes  1 set was done, and it was negative. White blood cell count was normal at 7.3, hemoglobin was 9.9 and platelet count was 190. Coagulation  panel was unremarkable. Blood cultures taken on the day of admission, 01/05/2014, showed no growth. ABGs were performed on 100% nonrebreather, revealed pH of 7.45, pCO2 was 43, pO2 was 43, saturation was 54.6% on 100% nonrebreather. The patient was admitted to the CCU.  He was started on BiPAP as well as antibiotic therapy, since his chest x-ray revealed a right-sided pneumonia, which was concerning for possible aspiration pneumonitis. The patient was continued on BiPAP, as well as antibiotics, and his condition improved. He was evaluated by speech therapist, who recommended pureed diet only. The patient is to continue antibiotic therapy, as well as steroid taper and nebulizers as an outpatient. He is to follow up with his primary care physician for further recommendations. We were able to wean him off oxygen, and on the day of discharge, the patient's oxygen O2 sats were 98% on room air at rest. However, O2 sats were not checked on exertion, and it is recommended to continue oxygen therapy for this patient, as long as he is short of breath. It is also recommended to intermittently use BiPAP if he is getting more short of breath. It is recommended to follow up with speech therapist as outpatient and make decisions about changing diet consistency, depending on his needs. In regards squamous cell multiple carcinoma, which was involving mandible, the patient did have intermittent mouth bleeding whenever he was continued on aspirin and Lovenox in the hospital. Those medications were stopped, and the patient's bleeding somewhat subsided. Hemoglobin was checked and followed along daily. Hemoglobin was checked on 01/08/2014, and it was 8.6. It is recommended to follow the patient's hemoglobin levels and make decisions about adding iron supplementation or transfuse the patient as needed if his bleeding worsens. The patient was seen by Dr. Doylene Canninghoksi on 01/09/2014, and felt that he is not a  candidate for any further  chemotherapy. Dr. Doylene Canninghoksi recommended hospice home. However, the patient's family decided against hospice home and decided to get the patient to a skilled nursing facility instead, where he will be discharged today. The patient is being discharged in stable condition with the above-mentioned medications and followup. Vital signs on the day of discharge, 01/09/2014, his temperature was 97.9, pulse was 76, respiration rate was 20, blood pressure 123, however, fluctuating between 97 to 120s systolic, and 50s, 60s  to 70s diastolic. O2 sats were 98% on room air at rest. It is recommended, however, to follow the patient's blood pressure readings, and his blood pressure medications are suspended since he was hypotensive intermittently in the hospital.   TIME SPENT: 40 minutes on this patient.   ____________________________ Katharina Caperima Jazira Maloney, MD rv:dmm D: 01/09/2014 11:44:00 ET T: 01/09/2014 12:20:41 ET JOB#: [id number]  cc: Leanna SatoLinda M. Miles, MD Gerome SamJanak K. Doylene Canninghoksi, MD Katharina Caperima Ger Ringenberg, MD, <Dictator> Alyna Stensland MD ELECTRONICALLY SIGNED 01/23/2014 14:17

## 2015-04-07 NOTE — Discharge Summary (Signed)
PATIENT NAME:  George Fisher, [name] MR#:  [id number] DATE OF BIRTH:  22-Jun-1941  DATE OF ADMISSION:  12/02/2013 DATE OF DISCHARGE:  12/07/2013  CONSULTANTS:  Dr. Willeen CassBennett from ENT.   PRIMARY ONCOLOGIST:  Dr. Doylene Canninghoksi.   CHIEF COMPLAINT:  Swelling in the right side of the neck.   PRIMARY CARE PHYSICIAN:  Dr. Darreld McleanLinda Miles.   DISCHARGE DIAGNOSES:  1.  Floor of the mouth and submandibular abscess status post drainage, and the patient has a Penrose drain currently.  2.  Suspected sepsis, secondary to above.  3.  Hypertension.  4.  History of stroke.  5.  Hypomagnesemia.  6.  Tobacco abuse.  7.  Anemia of chronic disease.  8.  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    DISCHARGE MEDICATIONS:  Clindamycin 300 mg 1 cap every 6 hours for 10 days, lactobacillus acidophilus 1 cap once a day, metoprolol succinate 25 mg 2 times a day, Lipitor 40 mg once a day, aspirin 81 mg daily, acetaminophen/hydrocodone 325/5 mg 1 tab every 6 hours, fentanyl patch every 72 hours, amlodipine 5 mg daily.   DISCHARGE INSTRUCTIONS:  He will be going home with home health with a nurse. Keep dressing clean and dress daily.   DIET:  Low sodium, consistency is puree with thin liquids, aspiration precautions. Meds in puree, crushed as able for easier swallowing. Drink supplements, moisten fluids well.   ACTIVITY:  As tolerated.   FOLLOWUP:  Please follow with oncologist within 1 to 2 weeks. Please follow with Dr. Willeen CassBennett within a week.   SIGNIFICANT LABORATORIES AND IMAGING:  Initial white count of 9.6, hemoglobin 10, platelets of 189. The wound cultures from the neck showing normal flora at 4 days. Blood cultures on day of admission no growth to date. BUN 11, creatinine 0.73; both on admission. Yesterday, magnesium was 1.3. Today, white count of 4.8, hemoglobin 8.1. CT of head without contrast showing new complex multiloculated rim-enhancing fluid collection involving the anterior floor of the  mouth and submandibular region consistent with abscess. There is increased osseous erosion of the mandible. There is increased submandibular duct dilations bilaterally, possible due to distal compression by the collection. CT of the neck with contrast with complex multiloculated rim-enhancing fluid collection in the anterior floor of the mouth and submandibular region consistent with abscess. Increased submandibular duct dilation bilaterally.   HISTORY OF PRESENT ILLNESS AND HOSPITAL COURSE:  For full details of H and P, please see dictation on December 19th by Dr. 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 He had received some steroids without any improvement as an outpatient. He came into the hospital and was noted to have a significant abscess. He was seen by Dr. Willeen CassBennett from ENT while in the ER, and it was locally drained. He was admitted to the hospitalist service, started on clindamycin. Blood cultures and wound cultures were obtained. Blood cultures have been negative. Wound cultures grew normal flora. The patient underwent local incision and drainage of the submandibular neck abscess on the 20th. A Penrose drain was placed; currently, that has been draining less and less daily. Given the history of radiation to the region, Dr. Willeen CassBennett preferred the patient to remain in the hospital for IV antibiotics given poor  blood flow to the radiation area. He has done well. That area is currently bandaged. He has had no significant fevers, no leukocytosis, and will be discharged with 10 more days of clindamycin with instructions to increase yogurt intake, as well as discharge on probiotics. Home health has been arranged for him for local wound care. He was seen by Dr. Doylene Canning and Dr. Sherrlyn Hock  while he was here. I suspect the patient likely had underlying sepsis, which has resolved with the drainage of the area. His blood pressure has been stable. He was counseled for 3 minutes about his ongoing tobacco abuse. He is motivated to stop. His magnesium was replaced.   PHYSICAL EXAMINATION:  VITAL SIGNS:  On the day of discharge, the patient had a temperature of 97.6, pulse 76, respiratory rate 20, blood pressure 114/67, O2 sat 97% on room air.  GENERAL:  The patient is a thin male sitting in bed, no obvious distress. The patient has a drain in the right lateral neck with some swelling on that side. Normal respiratory effort.  LUNGS:  Clear.  HEART:  Sounds are audible. S1, S2, irregularly irregular.  ABDOMEN:  Soft, nontender.  EXTREMITIES:  The patient has no edema in his lower extremities.   At this point, he will be discharged with outpatient followup with Dr. Doylene Canning and Dr. Willeen Cass for followup. The patient had a conversation about resuscitation, and he stated firmly that HE DOES NOT WANT TO BE RESUSCITATED. HE DOES NOT WANT TO BE INTUBATED.   Total Time Spent: 40 minutes.    ____________________________ George Eaton, MD sa:ms D: 12/07/2013 15:55:58 ET T: 12/07/2013 19:00:29 ET JOB#: [id number]  cc: George Eaton, MD, <Dictator> George Fisher. Doylene Canning, MD Leanna Sato, MD Marcelle Smiling [facility] MD ELECTRONICALLY SIGNED 12/27/2013 11:17

## 2015-04-08 NOTE — H&P (Signed)
PATIENT NAME:  George Fisher, George Fisher MR#:  [id number] DATE OF BIRTH:  05-30-1941  DATE OF ADMISSION:  01/25/2012  REFERRING PHYSICIAN: Dr. Mindi Junker    FAMILY PHYSICIAN: None.   REASON FOR ADMISSION: Intermittent chest pain worrisome for unstable angina.   HISTORY OF PRESENT ILLNESS: The patient is a 74 year old male with a history of previous stroke with chronic right lower extremity weakness, hypotension, and hyperlipidemia who presents with a one week history of intermittent chest pain described as pressure and tightness. The pain is nonradiating but is associated with some shortness of breath. No nausea, vomiting, or diaphoresis. Pain lasts up to 20 minutes. In the Emergency Room, the patient's initial enzymes and EKG were unremarkable. He is now admitted for further evaluation.   PAST MEDICAL HISTORY:  1. Previous stroke with chronic right lower extremity weakness.  2. Benign hypertension.  3. Hyperlipidemia.  4. Intermittent tobacco abuse.   MEDICATIONS:  1. Lipitor 40 mg p.o. daily.  2. Toprol-XL 25 mg p.o. b.i.d.  3. Aspirin 81 mg p.o. daily.   ALLERGIES: Lisinopril.   SOCIAL HISTORY: The patient smokes intermittently. Denies alcohol abuse.   FAMILY HISTORY: Positive for diabetes and coronary artery disease.   REVIEW OF SYSTEMS: CONSTITUTIONAL: No fever or change in weight. EYES: No blurred or double vision. No glaucoma. ENT: No tinnitus or hearing loss. No nasal discharge or bleeding. No difficulty swallowing. RESPIRATORY: No cough or wheezing. Denies hemoptysis. No painful respiration. CARDIOVASCULAR: No orthopnea or palpitations. No syncope. GI: No nausea, vomiting, or diarrhea. No abdominal pain. No change in bowel habits. GU: No dysuria or hematuria. No incontinence. ENDOCRINE: No polyuria or polydipsia. No heat or cold intolerance. HEMATOLOGIC: The patient denies anemia, easy bruising, or bleeding. LYMPHATIC: No swollen glands. MUSCULOSKELETAL: The patient has pain in his neck, back,  shoulders, knees, or hips. No gout. NEUROLOGIC: No numbness. Denies migraines or seizures. PSYCH: The patient denies anxiety, insomnia, or depression.   PHYSICAL EXAMINATION:   GENERAL: The patient is in no acute distress.   VITAL SIGNS: Vital signs remarkable for a blood pressure of 164/90 with a heart rate of 51 and a respiratory rate of 22. He is afebrile.   HEENT: Normocephalic, atraumatic. Pupils equally round and reactive to light and accommodation. Extraocular movements are intact. Sclerae are nonicteric. Conjunctivae are clear. Oropharynx is clear.   NECK: Supple without JVD or bruits. No adenopathy or thyromegaly is noted.   LUNGS: Clear to auscultation and percussion without wheezes, rales, or rhonchi. No dullness.   CARDIAC: Regular rate and rhythm with normal S1 and S2. No significant rubs, murmurs, or gallops. PMI is nondisplaced. Chest wall is nontender.   ABDOMEN: Soft, nontender with normoactive bowel sounds. No organomegaly or masses were appreciated. No hernias or bruits were noted.   EXTREMITIES: No clubbing, cyanosis, or edema. Pulses were 2+ bilaterally.   SKIN: Warm and dry without rash or lesions.   NEUROLOGIC: Cranial nerves II through XII grossly intact. Deep tendon reflexes were symmetric. Motor and sensory exams nonfocal.   PSYCH: The patient was alert and oriented to person, place, and time. He was cooperative and used good judgment.   LABORATORY DATA: EKG revealed sinus rhythm with no acute ischemic changes. CT scan of the chest revealed no pulmonary embolism. Chronic obstructive pulmonary disease changes were noted diffusely. Troponin was less than 0.02. Total CK was 75 with an MB of less than 0.5. Glucose was 96 with a BUN of 7 and a creatinine of 0.9 and a sodium of  143 with a potassium of 4.0 and a GFR of greater than 60. White count 4.5 with a hemoglobin of 14.6.   ASSESSMENT:  1. Intermittent chest pain worrisome for unstable angina.  2. Benign  hypertension.  3. Hyperlipidemia.  4. Previous stroke.  5. Relative bradycardia.   PLAN:  1. The patient will be observed on telemetry.  2. Will hold his beta-blocker.  3. Will use nitrates and Norvasc for blood pressure control.  4. Continue aspirin and add Lovenox. 5. Will follow serial cardiac enzymes. Assuming enzymes remain negative, will proceed with stress Myoview testing in the morning.  6. Further treatment and evaluation will depend upon the patient's progress.   TOTAL TIME SPENT ON THIS PATIENT: 45 minutes.   ____________________________ Duane LopeJeffrey D. Judithann SheenSparks, MD jds:drc D: 01/25/2012 13:29:34 ET T: 01/25/2012 13:49:54 ET JOB#: [id number]  cc: Duane LopeJeffrey D. Judithann SheenSparks, MD, <Dictator> JEFFREY Rodena Medin SPARKS MD ELECTRONICALLY SIGNED 01/25/2012 14:06

## 2015-04-08 NOTE — Discharge Summary (Signed)
PATIENT NAME:  George Fisher, George Fisher MR#:  [id number] DATE OF BIRTH:  18-May-1941  DATE OF ADMISSION:  01/25/2012 DATE OF DISCHARGE:  01/26/2012  ADMITTING DIAGNOSIS: Chest pain.   DISCHARGE DIAGNOSES:  1. Chest pain of noncardiac etiology, likely esophagitis related, negative cardiac stress test.  2. Hypertension.  3. Hyperlipidemia.  4. Bradycardia, resolved. 5. History of cerebrovascular accident. 6. Remote tobacco abuse.   DISCHARGE CONDITION: Stable.   DISCHARGE MEDICATIONS: Patient is to resume his outpatient medications which are:  1. Aspirin 81 mg p.o. daily.  2. Lipitor 40 mg p.o. daily.  3. Metoprolol succinate extended release 25 mg p.o. once daily.   ADDITIONAL MEDICATION: Omeprazole 40 mg p.o. daily.   HOME OXYGEN: None.   DIET: 2 grams salt, low fat, low cholesterol. Patient was advised not to eat 3 or 4 hours prior to going to bed and keep his head of bed elevated.    FOLLOW UP: Follow-up appointment with [facility] in two days after discharge.    CONSULTANT: Care management.   LABORATORY, DIAGNOSTIC AND RADIOLOGICAL DATA: Chest, portable single view x-ray 01/25/2012: Mild hyperinflation consistent with chronic obstructive pulmonary disease, old deformity of posterolateral lower right rib fractures are present. No evidence of pneumonia. CT of chest for pulmonary embolism with contrast 01/25/2012 revealed no evidence of acute pulmonary embolism. There is no evidence of congestive heart failure or acute thoracic or aortic pathology. There is mild thickening of the wall in the mid and lower thoracic esophagus which may reflect esophagitis. I see no bulky mediastinal or hilar lymph nodes. There are emphysematous changes in both lungs. There are a few 1 to 3 mm diameter pulmonary parenchymal nodules bilaterally which are likely inflammatory. There is apical pleural scarring bilaterally according to the radiologist.   EKG showed normal sinus rhythm with no acute ischemic changes. CT  of chest as mentioned above showed no pulmonary embolism except of chronic obstructive pulmonary disease changes. BMP was within normal limits. Albumin level was low at 3.3, otherwise liver enzymes were normal. Cardiac enzymes, first set, as well as subsequent two more sets, were within normal limits. CBC was within normal limits. Coagulation panel was unremarkable, however, the patient's d-dimer was slightly elevated at 0.48. Oxygen saturation was 95% on room air.   HISTORY OF PRESENT ILLNESS: Patient is a 74 year old male with past medical history significant for history of tobacco abuse, history of stroke in the past, also hypertension, hyperlipidemia presented to the hospital with complaints of chest pain. Please refer to Dr. Judithann SheenSparks' admission note on 01/25/2012. On arrival to the hospital patient's vital signs were remarkable for blood pressure 164/90 with heart rate of 51, respiratory rate 22. Patient was afebrile. His oxygenation was satisfactory on room air. His physical exam was unremarkable.   HOSPITAL COURSE:  1. Patient was admitted to the hospital to telemetry. His cardiac enzymes were cycled and Myoview stress test was performed. Myoview stress test done on 01/26/2012 read by Dr. Mariah MillingGollan. According to Dr. Windell HummingbirdGollan's report showed pharmacological myocardial perfusion imaging study with no significant ischemia noted, normal ejection fraction estimated at 69%. No wall motion abnormality, normal EKG. Overall scan is low risk. It was felt that patient's chest pain was very likely noncardiac and possibly related to esophagitis as patient's CT scan of chest revealed mild thickening of the wall in mid and lower thoracic esophagus which reflect esophagitis. For this reason patient was advised to stop eating a few hours prior to going to bed and start omeprazole orally. He  is recommended to follow up with his primary care physician and make decisions about further investigation and possibly even EGD if his  symptoms did not abate.  2. In regards to hypertension as well as hyperlipidemia, patient's patient blood pressure was noted to be severely elevated on arrival to the hospital, however, in the hospital he was noted to be somewhat bradycardic with heart rates going as low as 49 intermittently but otherwise between 50s and 60s. It was felt that patient's beta blockers were a little bit too high for patient and patient's Toprol-XL was decreased from 50 mg p.o. daily dose to 25 mg p.o. daily dose. Because of this hypertension which happened on arrival to the Emergency Room, patient was advised to follow up with his primary care physician and make decisions about adding any other blood pressure medications as outpatient. While in the hospital his blood pressure was quite okay ranging systolic from 116 to 142. It was unclear if patient's hypertension was related to pain or stress or angina was persistent with poorly controlled hypertension, however, patient was advised to follow up with his primary care physician and make decisions about adding any other blood pressure medications as necessary.  3. For stroke, patient is to continue aspirin therapy.  4. For hyperlipidemia, patient is to continue Lipitor.  5. Patient is being discharged in stable condition with above-mentioned medications and follow up. On day of discharge patient's vital signs: Temperature 98.8, pulse fluctuating between 40s to 50s and 90s, respiration rate 18, blood pressure systolic fluctuating between 116 to 142, diastolic 64 to 70s, oxygen saturation 96% to 99% on room air at rest.   TIME SPENT: 40 minutes.  ____________________________ Katharina Caper, MD rv:cms D: 01/26/2012 20:19:38 ET T: 01/27/2012 11:24:47 ET JOB#: [id number]  cc: Katharina Caper, MD, <Dictator> [facility] Kortnie Stovall MD ELECTRONICALLY SIGNED 02/02/2012 11:59
